# Patient Record
Sex: Male | Born: 1940 | Race: White | Hispanic: No | Marital: Single | Smoking: Former smoker
Health system: Southern US, Community
[De-identification: ages and names within clinical notes are randomized; demographics above are authoritative.]

## PROBLEM LIST (undated history)

## (undated) DIAGNOSIS — R0600 Dyspnea, unspecified: Secondary | ICD-10-CM

## (undated) DIAGNOSIS — G9340 Encephalopathy, unspecified: Secondary | ICD-10-CM

## (undated) HISTORY — DX: Encephalopathy, unspecified: G93.40

## (undated) HISTORY — DX: Dyspnea, unspecified: R06.00

## (undated) HISTORY — PX: BACK SURGERY: SHX140

---

## 2009-05-12 DEATH — deceased

## 2012-06-11 DEATH — deceased

## 2019-12-01 ENCOUNTER — Other Ambulatory Visit: Payer: Self-pay

## 2019-12-01 ENCOUNTER — Emergency Department: Payer: Medicare Other

## 2019-12-01 ENCOUNTER — Inpatient Hospital Stay
Admission: EM | Admit: 2019-12-01 | Discharge: 2019-12-04 | DRG: 178 | Disposition: A | Discharge status: Home Health | Payer: Medicare Other | Attending: Internal Medicine | Admitting: Internal Medicine

## 2019-12-01 DIAGNOSIS — R4182 Altered mental status, unspecified: Secondary | ICD-10-CM

## 2019-12-01 DIAGNOSIS — K5900 Constipation, unspecified: Secondary | ICD-10-CM | POA: Diagnosis present

## 2019-12-01 DIAGNOSIS — E861 Hypovolemia: Secondary | ICD-10-CM | POA: Diagnosis present

## 2019-12-01 DIAGNOSIS — U071 COVID-19: Secondary | ICD-10-CM | POA: Diagnosis present

## 2019-12-01 DIAGNOSIS — R059 Cough, unspecified: Secondary | ICD-10-CM | POA: Diagnosis present

## 2019-12-01 DIAGNOSIS — G9340 Encephalopathy, unspecified: Secondary | ICD-10-CM | POA: Diagnosis not present

## 2019-12-01 DIAGNOSIS — G9349 Other encephalopathy: Secondary | ICD-10-CM | POA: Diagnosis not present

## 2019-12-01 DIAGNOSIS — D509 Iron deficiency anemia, unspecified: Secondary | ICD-10-CM | POA: Diagnosis present

## 2019-12-01 DIAGNOSIS — E871 Hypo-osmolality and hyponatremia: Secondary | ICD-10-CM | POA: Diagnosis present

## 2019-12-01 DIAGNOSIS — E860 Dehydration: Secondary | ICD-10-CM | POA: Diagnosis present

## 2019-12-01 DIAGNOSIS — J9601 Acute respiratory failure with hypoxia: Secondary | ICD-10-CM | POA: Diagnosis not present

## 2019-12-01 DIAGNOSIS — N179 Acute kidney failure, unspecified: Secondary | ICD-10-CM | POA: Diagnosis not present

## 2019-12-01 DIAGNOSIS — A088 Other specified intestinal infections: Secondary | ICD-10-CM | POA: Diagnosis present

## 2019-12-01 DIAGNOSIS — R112 Nausea with vomiting, unspecified: Secondary | ICD-10-CM

## 2019-12-01 DIAGNOSIS — A0839 Other viral enteritis: Secondary | ICD-10-CM

## 2019-12-01 HISTORY — DX: COVID-19: U07.1

## 2019-12-01 LAB — CBC
HCT: 34.1 % — ABNORMAL LOW (ref 39.0–52.0)
Hemoglobin: 10.1 g/dL — ABNORMAL LOW (ref 13.0–17.0)
MCH: 23.2 pg — ABNORMAL LOW (ref 26.0–34.0)
MCHC: 29.6 g/dL — ABNORMAL LOW (ref 30.0–36.0)
MCV: 78.2 fL — ABNORMAL LOW (ref 80.0–100.0)
Platelets: 235 10*3/uL (ref 150–400)
RBC: 4.36 MIL/uL (ref 4.22–5.81)
RDW: 19.9 % — ABNORMAL HIGH (ref 11.5–15.5)
WBC: 6.5 10*3/uL (ref 4.0–10.5)
nRBC: 0 % (ref 0.0–0.2)

## 2019-12-01 LAB — COMPREHENSIVE METABOLIC PANEL
ALT: 16 U/L (ref 0–44)
AST: 32 U/L (ref 15–41)
Albumin: 4.1 g/dL (ref 3.5–5.0)
Alkaline Phosphatase: 47 U/L (ref 38–126)
Anion gap: 13 (ref 5–15)
BUN: 32 mg/dL — ABNORMAL HIGH (ref 8–23)
CO2: 21 mmol/L — ABNORMAL LOW (ref 22–32)
Calcium: 8.2 mg/dL — ABNORMAL LOW (ref 8.9–10.3)
Chloride: 100 mmol/L (ref 98–111)
Creatinine, Ser: 1.59 mg/dL — ABNORMAL HIGH (ref 0.61–1.24)
GFR, Estimated: 40 mL/min — ABNORMAL LOW (ref >60)
Glucose, Bld: 117 mg/dL — ABNORMAL HIGH (ref 70–99)
Potassium: 4.2 mmol/L (ref 3.5–5.1)
Sodium: 134 mmol/L — ABNORMAL LOW (ref 135–145)
Total Bilirubin: 0.9 mg/dL (ref 0.3–1.2)
Total Protein: 7.8 g/dL (ref 6.5–8.1)

## 2019-12-01 LAB — RESPIRATORY PANEL BY RT PCR (FLU A&B, COVID)
Influenza A by PCR: NEGATIVE
Influenza B by PCR: NEGATIVE
SARS Coronavirus 2 by RT PCR: POSITIVE — AB

## 2019-12-01 LAB — TROPONIN I (HIGH SENSITIVITY)
Troponin I (High Sensitivity): 9 ng/L (ref <18)
Troponin I (High Sensitivity): 13 ng/L (ref <18)

## 2019-12-01 LAB — AMMONIA: Ammonia: 13 umol/L (ref 9–35)

## 2019-12-01 LAB — TSH: TSH: 2.176 u[IU]/mL (ref 0.350–4.500)

## 2019-12-01 LAB — BRAIN NATRIURETIC PEPTIDE: B Natriuretic Peptide: 23.7 pg/mL (ref 0.0–100.0)

## 2019-12-01 LAB — MAGNESIUM: Magnesium: 2.6 mg/dL — ABNORMAL HIGH (ref 1.7–2.4)

## 2019-12-01 MED ORDER — SODIUM CHLORIDE 0.9 % IV BOLUS
500.0000 mL | Freq: Once | INTRAVENOUS | Status: AC
  Administered 2019-12-01: 500 mL via INTRAVENOUS

## 2019-12-01 MED ORDER — LABETALOL HCL 5 MG/ML IV SOLN
10.0000 mg | Freq: Once | INTRAVENOUS | Status: DC
  Filled 2019-12-01: qty 4

## 2019-12-01 MED ORDER — ACETAMINOPHEN 650 MG RE SUPP: 650.0000 mg | Freq: Four times a day (QID) | RECTAL | Status: DC | PRN

## 2019-12-01 MED ORDER — ONDANSETRON HCL 4 MG/2ML IJ SOLN: 4.0000 mg | Freq: Four times a day (QID) | INTRAMUSCULAR | Status: DC | PRN

## 2019-12-01 MED ORDER — ACETAMINOPHEN 325 MG PO TABS
650.0000 mg | ORAL_TABLET | Freq: Four times a day (QID) | ORAL | Status: DC | PRN
  Administered 2019-12-04: 650 mg via ORAL
  Filled 2019-12-01: qty 2

## 2019-12-01 MED ORDER — ENOXAPARIN SODIUM 40 MG/0.4ML ~~LOC~~ SOLN
40.0000 mg | SUBCUTANEOUS | Status: DC
  Administered 2019-12-02 – 2019-12-04 (×3): 40 mg via SUBCUTANEOUS
  Filled 2019-12-01 (×3): qty 0.4

## 2019-12-01 MED ORDER — TRAZODONE HCL 50 MG PO TABS: 25.0000 mg | ORAL_TABLET | Freq: Every evening | ORAL | Status: DC | PRN

## 2019-12-01 MED ORDER — SODIUM CHLORIDE 0.9 % IV SOLN: INTRAVENOUS | Status: DC

## 2019-12-01 MED ORDER — ONDANSETRON HCL 4 MG PO TABS: 4.0000 mg | ORAL_TABLET | Freq: Four times a day (QID) | ORAL | Status: DC | PRN

## 2019-12-01 MED ORDER — ASPIRIN EC 81 MG PO TBEC
81.0000 mg | DELAYED_RELEASE_TABLET | Freq: Every day | ORAL | Status: DC
  Administered 2019-12-02 – 2019-12-04 (×3): 81 mg via ORAL
  Filled 2019-12-01 (×3): qty 1

## 2019-12-01 NOTE — ED Provider Notes (Signed)
[facility] Emergency Department Provider Note  ____________________________________________   First MD Initiated Contact with Patient 12/01/19 2059     (approximate)  I have reviewed the triage vital signs and the nursing notes.   HISTORY  Chief Complaint Cough, Fever, and Altered Mental Status   HPI Dennis Wilkins is a 79 y.o. male without significant past medical history who presents for assessment of 1 to 2 weeks of cough associate with subjective fevers, chills, severe fatigue, and increasing forgetfulness.  No prior similar episodes.  No clear alleviating aggravating factors.  Patient denies any headache, vision changes, vertigo, chest pain, shortness of breath, vomiting, diarrhea, dysuria, abdominal pain, rash, urinary symptoms, focal extremity pain, recent falls or injuries, EtOH abuse, illicit drug use, tobacco abuse, or other acute sick symptoms.  Denies taking any daily medications or supplements.     History reviewed. No pertinent past medical history.  There are no problems to display for this patient.    Prior to Admission medications   Not on File    Allergies Patient has no known allergies.  History reviewed. No pertinent family history.  Social History Social History   Tobacco Use  . Smoking status: Not on file  Substance Use Topics  . Alcohol use: Not on file  . Drug use: Not on file    Review of Systems  Review of Systems  Constitutional: Positive for chills, fever and malaise/fatigue.  HENT: Negative for sore throat.   Eyes: Negative for pain.  Respiratory: Positive for cough. Negative for stridor.   Cardiovascular: Negative for chest pain.  Gastrointestinal: Positive for diarrhea ( per son), nausea ( per son) and vomiting ( per son).  Genitourinary: Negative for dysuria.  Musculoskeletal: Positive for myalgias.  Skin: Negative for rash.  Neurological: Negative for seizures, loss of consciousness and headaches.    Psychiatric/Behavioral: Positive for memory loss. Negative for suicidal ideas.  All other systems reviewed and are negative.     ____________________________________________   PHYSICAL EXAM:  VITAL SIGNS: ED Triage Vitals  Enc Vitals Group     BP 12/01/19 1450 (!) 180/84     Pulse Rate 12/01/19 1450 100     Resp 12/01/19 1450 18     Temp 12/01/19 1450 98.8 F (37.1 C)     Temp Source 12/01/19 1450 Oral     SpO2 12/01/19 1450 100 %     Weight 12/01/19 1452 180 lb (81.6 kg)     Height 12/01/19 1452 5\' 9"  (1.753 m)     Head Circumference --      Peak Flow --      Pain Score 12/01/19 1452 5     Pain Loc --      Pain Edu? --      Excl. in GC? --    Vitals:   12/01/19 1450 12/01/19 2220  BP: (!) 180/84 (!) 157/69  Pulse: 100 98  Resp: 18 16  Temp: 98.8 F (37.1 C)   SpO2: 100% 99%   Physical Exam Vitals and nursing note reviewed.  Constitutional:      Appearance: He is well-developed.  HENT:     Head: Normocephalic and atraumatic.     Right Ear: External ear normal.     Left Ear: External ear normal.     Nose: Nose normal.     Mouth/Throat:     Mouth: Mucous membranes are dry.  Eyes:     Conjunctiva/sclera: Conjunctivae normal.  Cardiovascular:  Rate and Rhythm: Normal rate and regular rhythm.     Heart sounds: No murmur heard.   Pulmonary:     Effort: Pulmonary effort is normal. No respiratory distress.     Breath sounds: Normal breath sounds.  Abdominal:     Palpations: Abdomen is soft.     Tenderness: There is no abdominal tenderness.  Musculoskeletal:     Cervical back: Neck supple.  Skin:    General: Skin is warm and dry.     Capillary Refill: Capillary refill takes 2 to 3 seconds.  Neurological:     General: No focal deficit present.     Mental Status: He is alert.     No pronator drift.  No finger dysmetria.  Cranial nerves II through XII grossly intact.  Patient has full and symmetric strength of all extremities.  Sensation is intact to  light touch throughout all extremities.  Patient is oriented to day and year but not month.  Patient states it is January.  ____________________________________________   LABS (all labs ordered are listed, but only abnormal results are displayed)  Labs Reviewed  COMPREHENSIVE METABOLIC PANEL - Abnormal; Notable for the following components:      Result Value   Sodium 134 (*)    CO2 21 (*)    Glucose, Bld 117 (*)    BUN 32 (*)    Creatinine, Ser 1.59 (*)    Calcium 8.2 (*)    GFR, Estimated 40 (*)    All other components within normal limits  CBC - Abnormal; Notable for the following components:   Hemoglobin 10.1 (*)    HCT 34.1 (*)    MCV 78.2 (*)    MCH 23.2 (*)    MCHC 29.6 (*)    RDW 19.9 (*)    All other components within normal limits  MAGNESIUM - Abnormal; Notable for the following components:   Magnesium 2.6 (*)    All other components within normal limits  RESPIRATORY PANEL BY RT PCR (FLU A&B, COVID)  BRAIN NATRIURETIC PEPTIDE  AMMONIA  PROCALCITONIN  TSH  URINALYSIS, COMPLETE (UACMP) WITH MICROSCOPIC  CBG MONITORING, ED  TROPONIN I (HIGH SENSITIVITY)  TROPONIN I (HIGH SENSITIVITY)   ____________________________________________  EKG  Sinus rhythm with a ventricular rate of 97, normal axis, unremarkable intervals, nonspecific ST changes in the inferior and lateral leads with some artifact in aVL and aVF without other clear evidence of acute ischemia. ____________________________________________  RADIOLOGY  ED MD interpretation: No significant focal consolidation, effusion, pneumothorax, edema, or other clear acute intrathoracic abnormality.  Official radiology report(s): DG Chest 2 View  Result Date: 12/01/2019 CLINICAL DATA:  Shortness of breath. Additional history provided: Cough, fever, forgetfulness, symptoms for 1 week. EXAM: CHEST - 2 VIEW COMPARISON:  No pertinent prior exams are available for comparison. FINDINGS: Heart size within normal limits.  Aortic atherosclerosis. Minimal atelectasis at the left lung base. No appreciable airspace consolidation or pulmonary edema. No evidence of pleural effusion or pneumothorax. No acute bony abnormality identified. IMPRESSION: Minimal atelectasis at the left lung base. Otherwise, there is no evidence of acute cardiopulmonary abnormality. Aortic Atherosclerosis (ICD10-I70.0). Electronically Signed   By: Jackey Loge DO   On: 12/01/2019 15:20   CT Head Wo Contrast  Result Date: 12/01/2019 CLINICAL DATA:  Cough and fever with mental status changes EXAM: CT HEAD WITHOUT CONTRAST TECHNIQUE: Contiguous axial images were obtained from the base of the skull through the vertex without intravenous contrast. COMPARISON:  None. FINDINGS: Brain: No evidence  of acute infarction, hemorrhage, hydrocephalus, extra-axial collection or mass lesion/mass effect. Chronic atrophic and ischemic changes are noted. Vascular: No hyperdense vessel or unexpected calcification. Skull: Normal. Negative for fracture or focal lesion. Sinuses/Orbits: Orbits and their contents are within normal limits. Air-fluid levels are noted within the sphenoid sinus with mucosal changes in the ethmoid sinuses consistent with sinusitis. Other: None IMPRESSION: Chronic atrophic and ischemic changes. No acute intracranial abnormality noted. Changes consistent with acute sinusitis as described. Electronically Signed   By: Alcide Clever M.D.   On: 12/01/2019 21:24    ____________________________________________   PROCEDURES  Procedure(s) performed (including Critical Care):  .1-3 Lead EKG Interpretation Performed by: Gilles Chiquito, MD Authorized by: Gilles Chiquito, MD     Interpretation: normal     ECG rate assessment: normal     Rhythm: sinus rhythm     Ectopy: none       ____________________________________________   INITIAL IMPRESSION / ASSESSMENT AND PLAN / ED COURSE        Patient presents with above to history exam for  assessment of cough, fatigue, subjective fevers and chills, and forgetfulness over the past several weeks.  Patient is hypertensive with a BP of 180/84 with otherwise stable vital signs on room air.  Differential includes but is not limited to pneumonia, Covid, ACS, hypertensive encephalopathy, metabolic derangements, uremic encephalopathy, urinary tract infection, CVA, elevated ammonia, thyroid derangements, and other infectious etiologies.  No focal deficits on exam or CT head findings or other historical findings to suggest CVA.  No historical or exam factors to suggest an acute traumatic injury.  Chest x-ray shows no evidence of pneumonia.  CBC does show evidence of anemia with a hemoglobin of 10.1 with no other significant derangements.  There are no prior CBCs to compare this to.  CMP shows reduced kidney function with a creatinine of 1.59 and a GFR of 40 without any other significant electrolyte or metabolic derangements.  LFTs are unremarkable.  There is no prior kidney function to compare to in the medical record.  Magnesium is 2.6.  Low suspicion for ACS given patient denies any chest pain and has a relatively reassuring EKG and nonelevated troponin.  BNP is 23.7 and patient has no findings on chest x-ray exam suggest significant volume overload.  Ammonia is not elevated.  I was able to reach patient's son stated that patient also had been complaining of some vomiting and diarrhea.  He also stated his father is typically alert and oriented.  Vital sign possible patient is possible patient is encephalopathic from dehydration elevated BUN with viral infection including COVID-19 high within differential also plan to assess for urinary tract infection with UA.  Given patient is encephalopathic and not oriented I do believe she warrants admission for observation and further work-up.  ____________________________________________   FINAL CLINICAL IMPRESSION(S) / ED DIAGNOSES  Final diagnoses:    Acute encephalopathy  Altered mental status, unspecified altered mental status type  Acute kidney injury (HCC)    Medications  labetalol (NORMODYNE) injection 10 mg (10 mg Intravenous Not Given 12/01/19 2221)  sodium chloride 0.9 % bolus 500 mL (has no administration in time range)     ED Discharge Orders    None       Note:  This document was prepared using Dragon voice recognition software and may include unintentional dictation errors.   Gilles Chiquito, MD 12/01/19 [phone]

## 2019-12-01 NOTE — ED Triage Notes (Signed)
Pt comes POV with cough, fever, and "forgetting stuff". Symptoms started about a week. Pt ambulatory to triage.

## 2019-12-02 ENCOUNTER — Encounter: Payer: Self-pay | Admitting: Family Medicine

## 2019-12-02 ENCOUNTER — Other Ambulatory Visit: Payer: Self-pay

## 2019-12-02 DIAGNOSIS — G9340 Encephalopathy, unspecified: Secondary | ICD-10-CM

## 2019-12-02 LAB — FERRITIN
Ferritin: 27 ng/mL (ref 24–336)
Ferritin: 30 ng/mL (ref 24–336)

## 2019-12-02 LAB — TROPONIN I (HIGH SENSITIVITY)
Troponin I (High Sensitivity): 13 ng/L (ref <18)
Troponin I (High Sensitivity): 15 ng/L (ref <18)

## 2019-12-02 LAB — CBC WITH DIFFERENTIAL/PLATELET
Abs Immature Granulocytes: 0.02 10*3/uL (ref 0.00–0.07)
Basophils Absolute: 0 10*3/uL (ref 0.0–0.1)
Basophils Relative: 0 %
Eosinophils Absolute: 0 10*3/uL (ref 0.0–0.5)
Eosinophils Relative: 0 %
HCT: 31.1 % — ABNORMAL LOW (ref 39.0–52.0)
Hemoglobin: 9.3 g/dL — ABNORMAL LOW (ref 13.0–17.0)
Immature Granulocytes: 0 %
Lymphocytes Relative: 15 %
Lymphs Abs: 0.8 10*3/uL (ref 0.7–4.0)
MCH: 23.5 pg — ABNORMAL LOW (ref 26.0–34.0)
MCHC: 29.9 g/dL — ABNORMAL LOW (ref 30.0–36.0)
MCV: 78.5 fL — ABNORMAL LOW (ref 80.0–100.0)
Monocytes Absolute: 0.3 10*3/uL (ref 0.1–1.0)
Monocytes Relative: 5 %
Neutro Abs: 4.1 10*3/uL (ref 1.7–7.7)
Neutrophils Relative %: 80 %
Platelets: 190 10*3/uL (ref 150–400)
RBC: 3.96 MIL/uL — ABNORMAL LOW (ref 4.22–5.81)
RDW: 20.4 % — ABNORMAL HIGH (ref 11.5–15.5)
WBC: 5.2 10*3/uL (ref 4.0–10.5)
nRBC: 0 % (ref 0.0–0.2)

## 2019-12-02 LAB — COMPREHENSIVE METABOLIC PANEL
ALT: 16 U/L (ref 0–44)
AST: 29 U/L (ref 15–41)
Albumin: 3.5 g/dL (ref 3.5–5.0)
Alkaline Phosphatase: 46 U/L (ref 38–126)
Anion gap: 11 (ref 5–15)
BUN: 29 mg/dL — ABNORMAL HIGH (ref 8–23)
CO2: 19 mmol/L — ABNORMAL LOW (ref 22–32)
Calcium: 7.4 mg/dL — ABNORMAL LOW (ref 8.9–10.3)
Chloride: 105 mmol/L (ref 98–111)
Creatinine, Ser: 1.21 mg/dL (ref 0.61–1.24)
GFR, Estimated: 56 mL/min — ABNORMAL LOW (ref >60)
Glucose, Bld: 119 mg/dL — ABNORMAL HIGH (ref 70–99)
Potassium: 4.2 mmol/L (ref 3.5–5.1)
Sodium: 135 mmol/L (ref 135–145)
Total Bilirubin: 0.8 mg/dL (ref 0.3–1.2)
Total Protein: 6.7 g/dL (ref 6.5–8.1)

## 2019-12-02 LAB — C-REACTIVE PROTEIN: CRP: 4 mg/dL — ABNORMAL HIGH (ref <1.0)

## 2019-12-02 LAB — URINALYSIS, COMPLETE (UACMP) WITH MICROSCOPIC
Bacteria, UA: NONE SEEN
Bilirubin Urine: NEGATIVE
Glucose, UA: NEGATIVE mg/dL
Hgb urine dipstick: NEGATIVE
Ketones, ur: 20 mg/dL — AB
Leukocytes,Ua: NEGATIVE
Nitrite: NEGATIVE
Protein, ur: 30 mg/dL — AB
Specific Gravity, Urine: 1.023 (ref 1.005–1.030)
Squamous Epithelial / HPF: NONE SEEN (ref 0–5)
pH: 5 (ref 5.0–8.0)

## 2019-12-02 LAB — FIBRIN DERIVATIVES D-DIMER (ARMC ONLY)
Fibrin derivatives D-dimer (ARMC): 786.57 ng/mL (FEU) — ABNORMAL HIGH (ref 0.00–499.00)
Fibrin derivatives D-dimer (ARMC): 810.09 ng/mL (FEU) — ABNORMAL HIGH (ref 0.00–499.00)

## 2019-12-02 LAB — LACTATE DEHYDROGENASE: LDH: 183 U/L (ref 98–192)

## 2019-12-02 LAB — PROCALCITONIN
Procalcitonin: <0.1 ng/mL
Procalcitonin: <0.1 ng/mL

## 2019-12-02 MED ORDER — METHYLPREDNISOLONE SODIUM SUCC 125 MG IJ SOLR
1.0000 mg/kg | Freq: Two times a day (BID) | INTRAMUSCULAR | Status: DC
  Administered 2019-12-02 (×2): 81.875 mg via INTRAVENOUS
  Filled 2019-12-02 (×2): qty 2

## 2019-12-02 MED ORDER — GUAIFENESIN ER 600 MG PO TB12
600.0000 mg | ORAL_TABLET | Freq: Two times a day (BID) | ORAL | Status: DC
  Administered 2019-12-02 – 2019-12-04 (×6): 600 mg via ORAL
  Filled 2019-12-02 (×6): qty 1

## 2019-12-02 MED ORDER — HYDROCOD POLST-CPM POLST ER 10-8 MG/5ML PO SUER: 5.0000 mL | Freq: Two times a day (BID) | ORAL | Status: DC | PRN

## 2019-12-02 MED ORDER — VITAMIN D 25 MCG (1000 UNIT) PO TABS
1000.0000 [IU] | ORAL_TABLET | Freq: Every day | ORAL | Status: DC
  Administered 2019-12-02 – 2019-12-04 (×3): 1000 [IU] via ORAL
  Filled 2019-12-02 (×3): qty 1

## 2019-12-02 MED ORDER — SODIUM CHLORIDE 0.9 % IV SOLN
200.0000 mg | Freq: Once | INTRAVENOUS | Status: AC
  Administered 2019-12-02: 200 mg via INTRAVENOUS
  Filled 2019-12-02: qty 200

## 2019-12-02 MED ORDER — ASCORBIC ACID 500 MG PO TABS
500.0000 mg | ORAL_TABLET | Freq: Every day | ORAL | Status: DC
  Administered 2019-12-02 – 2019-12-04 (×3): 500 mg via ORAL
  Filled 2019-12-02 (×3): qty 1

## 2019-12-02 MED ORDER — MELATONIN 5 MG PO TABS
5.0000 mg | ORAL_TABLET | Freq: Every day | ORAL | Status: DC
  Filled 2019-12-02: qty 1

## 2019-12-02 MED ORDER — DIPHENHYDRAMINE HCL 50 MG/ML IJ SOLN
25.0000 mg | Freq: Once | INTRAMUSCULAR | Status: AC
  Administered 2019-12-02: 25 mg via INTRAVENOUS
  Filled 2019-12-02: qty 1

## 2019-12-02 MED ORDER — ZINC SULFATE 220 (50 ZN) MG PO CAPS
220.0000 mg | ORAL_CAPSULE | Freq: Every day | ORAL | Status: DC
  Administered 2019-12-02 – 2019-12-04 (×3): 220 mg via ORAL
  Filled 2019-12-02 (×3): qty 1

## 2019-12-02 MED ORDER — GUAIFENESIN-DM 100-10 MG/5ML PO SYRP
10.0000 mL | ORAL_SOLUTION | ORAL | Status: DC | PRN
  Filled 2019-12-02: qty 10

## 2019-12-02 MED ORDER — PREDNISONE 50 MG PO TABS: 50.0000 mg | ORAL_TABLET | Freq: Every day | ORAL | Status: DC

## 2019-12-02 MED ORDER — SODIUM CHLORIDE 0.9 % IV SOLN
100.0000 mg | Freq: Every day | INTRAVENOUS | Status: DC
  Administered 2019-12-02 – 2019-12-04 (×3): 100 mg via INTRAVENOUS
  Filled 2019-12-02 (×4): qty 20

## 2019-12-02 MED ORDER — RAMELTEON 8 MG PO TABS
8.0000 mg | ORAL_TABLET | Freq: Every day | ORAL | Status: DC
  Administered 2019-12-02 – 2019-12-03 (×2): 8 mg via ORAL
  Filled 2019-12-02 (×4): qty 1

## 2019-12-02 NOTE — Progress Notes (Signed)
PROGRESS NOTE    Dennis Wilkins   RSW:[id number]  DOB: [birth year]  PCP: Patient, No Pcp Per    DOA: 12/01/2019 LOS: 1   Brief Narrative   Dennis Wilkins  is a 79 y.o. Caucasian male with no known chronic medical problems, who presented to the ED on 12/01/19 with altered mental status with declining memory over the last couple of weeks with subjective fever and chills.  Son also reported nausea/vomiting and diarrhea, dry cough without shortness of breath.  Evaluation in the ED revealed Covid-19 infection, uncontrolled BP, oxygen sat with ambulation on room air was in mid-80's.  Patient treated in the ED with labetalol, IV fluids, IV steroid, and started on remdesivir.     Assessment & Plan   Active Problems:   Encephalopathy   COVID-19   Covid-19 Infection with gastroenteritis - presented with GI symptoms and confusion, dry cough Acute encephalpathy due to Covid-19 - presented with confusion Acute respiratory failure with hypoxia - initially required supplemental oxygen briefly, since weaned off.   --Continue remdesevir (10/21 >> ) --Stop steroids since not hypoxic and minimal respiratory symptoms, only mild dry cough, concern steroids will worsen his confusion --O2 if needed to keep spO2>90% --Follow inflammatory markers, CMP, CBC --Antibiotics deferred given negative procal on admission --Lovenox for VTE prophylaxis --Antitussives PRN --Vitamin C, zinc, vitamin D, ASA --IV fluids  Acute kidney injury - present on admission likely due to hypovolemia in setting of N/V/D, poor PO intake. Cr trend: 1.59 >> 1.12.  Resolved as of 10/21. --Stop IV fluids --BMP to monitor --Avoid nephrotoxins and hypotension   DVT prophylaxis: enoxaparin (LOVENOX) injection 40 mg Start: 12/01/19 1000   Diet:  Diet Orders (From admission, onward)    Start     Ordered   12/02/19 0000  Diet Heart Room service appropriate? Yes; Fluid consistency: Thin  Diet effective now       Question Answer  Comment  Room service appropriate? Yes   Fluid consistency: Thin      12/02/19 0002            Code Status: Full Code    Subjective 12/02/19    Pt seen this AM in ED holding for a bed.  He reports still being confused, but little better today.  Denies fever/chills or shortness of breath.   Disposition Plan & Communication   Status is: Inpatient  Remains inpatient appropriate because:IV treatments appropriate due to intensity of illness or inability to take PO   Dispo: The patient is from: Home              Anticipated d/c is to: Home              Anticipated d/c date is: 2-3 days              Patient currently is not medically stable to d/c.    Family Communication: spoke with daughter who called patient's cell phone during encounter.  Called son and updated him by phone this afternoon.  Pt lives alone in a condo.  Independent with ambulation.   Consults, Procedures, Significant Events   Consultants:   none  Procedures:   none  Antimicrobials:  Anti-infectives (From admission, onward)   Start     Dose/Rate Route Frequency Ordered Stop   12/02/19 1000  remdesivir 100 mg in sodium chloride 0.9 % 100 mL IVPB       "Followed by" Linked Group Details   100 mg 200 mL/hr over 30  Minutes Intravenous Daily 12/02/19 0002 12/06/19 0959   12/02/19 0015  remdesivir 200 mg in sodium chloride 0.9% 250 mL IVPB       "Followed by" Linked Group Details   200 mg 580 mL/hr over 30 Minutes Intravenous Once 12/02/19 0002 12/02/19 0249         Objective   Vitals:   12/02/19 0950 12/02/19 1100 12/02/19 1300 12/02/19 1502  BP: (!) 166/81 (!) 111/93 (!) 149/83 (!) 174/77  Pulse: 80 98 76 79  Resp: 20 15 19 16   Temp: (!) 97.4 F (36.3 C)   97.9 F (36.6 C)  TempSrc: Oral   Oral  SpO2: 97% 98% 96% 98%  Weight:      Height:       No intake or output data in the 24 hours ending 12/02/19 1559 Filed Weights   12/01/19 1452  Weight: 81.6 kg    Physical Exam:  General  exam: awake, alert, no acute distress, mildly confused Respiratory system: CTAB, no wheezes, rales or rhonchi, normal respiratory effort. Cardiovascular system: normal S1/S2, RRR, no JVD, murmurs, rubs, gallops, no pedal edema.   Gastrointestinal system: soft, NT, ND, +bowel sounds. Central nervous system: A&O x3 (not oriented to situation). no gross focal neurologic deficits, normal speech Extremities: moves all, no edema, normal tone Psychiatry: normal mood, congruent affect, abnormal judgement and insight normal  Labs   Data Reviewed: I have personally reviewed following labs and imaging studies  CBC: Recent Labs  Lab 12/01/19 1504 12/02/19 0515  WBC 6.5 5.2  NEUTROABS  --  4.1  HGB 10.1* 9.3*  HCT 34.1* 31.1*  MCV 78.2* 78.5*  PLT 235 190   Basic Metabolic Panel: Recent Labs  Lab 12/01/19 1504 12/01/19 2034 12/02/19 0515  NA 134*  --  135  K 4.2  --  4.2  CL 100  --  105  CO2 21*  --  19*  GLUCOSE 117*  --  119*  BUN 32*  --  29*  CREATININE 1.59*  --  1.21  CALCIUM 8.2*  --  7.4*  MG  --  2.6*  --    GFR: Estimated Creatinine Clearance: 47.9 mL/min (by C-G formula based on SCr of 1.21 mg/dL). Liver Function Tests: Recent Labs  Lab 12/01/19 1504 12/02/19 0515  AST 32 29  ALT 16 16  ALKPHOS 47 46  BILITOT 0.9 0.8  PROT 7.8 6.7  ALBUMIN 4.1 3.5   No results for input(s): LIPASE, AMYLASE in the last 168 hours. Recent Labs  Lab 12/01/19 2220  AMMONIA 13   Coagulation Profile: No results for input(s): INR, PROTIME in the last 168 hours. Cardiac Enzymes: No results for input(s): CKTOTAL, CKMB, CKMBINDEX, TROPONINI in the last 168 hours. BNP (last 3 results) No results for input(s): PROBNP in the last 8760 hours. HbA1C: No results for input(s): HGBA1C in the last 72 hours. CBG: No results for input(s): GLUCAP in the last 168 hours. Lipid Profile: No results for input(s): CHOL, HDL, LDLCALC, TRIG, CHOLHDL, LDLDIRECT in the last 72 hours. Thyroid  Function Tests: Recent Labs    12/01/19 2220  TSH 2.176   Anemia Panel: Recent Labs    12/02/19 0104 12/02/19 0515  FERRITIN 27 30   Sepsis Labs: Recent Labs  Lab 12/01/19 2034 12/02/19 0104  PROCALCITON <0.10 <0.10    Recent Results (from the past 240 hour(s))  Respiratory Panel by RT PCR (Flu A&B, Covid) - Nasopharyngeal Swab     Status: Abnormal  Collection Time: 12/01/19 10:20 PM   Specimen: Nasopharyngeal Swab  Result Value Ref Range Status   SARS Coronavirus 2 by RT PCR POSITIVE (A) NEGATIVE Final    Comment: RESULT CALLED TO, READ BACK BY AND VERIFIED WITH: NOAH Hussain Maimone RN 2340 0/20/21 HNM (NOTE) SARS-CoV-2 target nucleic acids are DETECTED.  SARS-CoV-2 RNA is generally detectable in upper respiratory specimens  during the acute phase of infection. Positive results are indicative of the presence of the identified virus, but do not rule out bacterial infection or co-infection with other pathogens not detected by the test. Clinical correlation with patient history and other diagnostic information is necessary to determine patient infection status. The expected result is Negative.  Fact Sheet for Patients:  https://www.moore.com/  Fact Sheet for Healthcare Providers: https://www.young.biz/  This test is not yet approved or cleared by the [place] FDA and  has been authorized for detection and/or diagnosis of SARS-CoV-2 by FDA under an Emergency Use Authorization (EUA).  This EUA will remain in effect (meaning this test can be u sed) for the duration of  the COVID-19 declaration under Section 564(b)(1) of the Act, 21 U.S.C. section 360bbb-3(b)(1), unless the authorization is terminated or revoked sooner.      Influenza A by PCR NEGATIVE NEGATIVE Final   Influenza B by PCR NEGATIVE NEGATIVE Final    Comment: (NOTE) The Xpert Xpress SARS-CoV-2/FLU/RSV assay is intended as an aid in  the diagnosis of influenza  from Nasopharyngeal swab specimens and  should not be used as a sole basis for treatment. Nasal washings and  aspirates are unacceptable for Xpert Xpress SARS-CoV-2/FLU/RSV  testing.  Fact Sheet for Patients: https://www.moore.com/  Fact Sheet for Healthcare Providers: https://www.young.biz/  This test is not yet approved or cleared by the [place] FDA and  has been authorized for detection and/or diagnosis of SARS-CoV-2 by  FDA under an Emergency Use Authorization (EUA). This EUA will remain  in effect (meaning this test can be used) for the duration of the  Covid-19 declaration under Section 564(b)(1) of the Act, 21  U.S.C. section 360bbb-3(b)(1), unless the authorization is  terminated or revoked. Performed at [facility], [address] [place], [place] [address]       Imaging Studies   DG Chest 2 View  Result Date: 12/01/2019 CLINICAL DATA:  Shortness of breath. Additional history provided: Cough, fever, forgetfulness, symptoms for 1 week. EXAM: CHEST - 2 VIEW COMPARISON:  No pertinent prior exams are available for comparison. FINDINGS: Heart size within normal limits. Aortic atherosclerosis. Minimal atelectasis at the left lung base. No appreciable airspace consolidation or pulmonary edema. No evidence of pleural effusion or pneumothorax. No acute bony abnormality identified. IMPRESSION: Minimal atelectasis at the left lung base. Otherwise, there is no evidence of acute cardiopulmonary abnormality. Aortic Atherosclerosis (ICD10-I70.0). Electronically Signed   By: Jackey Loge DO   On: 12/01/2019 15:20   CT Head Wo Contrast  Result Date: 12/01/2019 CLINICAL DATA:  Cough and fever with mental status changes EXAM: CT HEAD WITHOUT CONTRAST TECHNIQUE: Contiguous axial images were obtained from the base of the skull through the vertex without intravenous contrast. COMPARISON:  None. FINDINGS: Brain: No evidence of acute  infarction, hemorrhage, hydrocephalus, extra-axial collection or mass lesion/mass effect. Chronic atrophic and ischemic changes are noted. Vascular: No hyperdense vessel or unexpected calcification. Skull: Normal. Negative for fracture or focal lesion. Sinuses/Orbits: Orbits and their contents are within normal limits. Air-fluid levels are noted within the sphenoid sinus with mucosal changes in  the ethmoid sinuses consistent with sinusitis. Other: None IMPRESSION: Chronic atrophic and ischemic changes. No acute intracranial abnormality noted. Changes consistent with acute sinusitis as described. Electronically Signed   By: Alcide CleverMark  Lukens M.D.   On: 12/01/2019 21:24     Medications   Scheduled Meds: . vitamin C  500 mg Oral Daily  . aspirin EC  81 mg Oral Daily  . cholecalciferol  1,000 Units Oral Daily  . enoxaparin (LOVENOX) injection  40 mg Subcutaneous Q24H  . guaiFENesin  600 mg Oral BID  . labetalol  10 mg Intravenous Once  . methylPREDNISolone (SOLU-MEDROL) injection  1 mg/kg Intravenous Q12H   Followed by  . [START ON 12/05/2019] predniSONE  50 mg Oral Daily  . ramelteon  8 mg Oral QHS  . zinc sulfate  220 mg Oral Daily   Continuous Infusions: . sodium chloride 100 mL/hr at 12/02/19 0249  . remdesivir 100 mg in NS 100 mL Stopped (12/02/19 1023)       LOS: 1 day    Time spent: 30 minutes    Pennie BanterKelly A Len Azeez, DO [facility]  12/02/2019, 3:59 PM    If 7PM-7AM, please contact night-coverage. How to contact the [facility] Attending or Consulting provider 7A - 7P or covering provider during after hours 7P -7A, for this patient?    1. Check the care team in [facility] and look for a) attending/consulting [facility] provider listed and b) the [facility] team listed 2. Log into [email or web address] and use [place]'s universal password to access. If you do not have the password, please contact the hospital operator. 3. Locate the [facility] provider you are looking for under [facility] and page to a  number that you can be directly reached. 4. If you still have difficulty reaching the provider, please page the [facility] (Director on Call) for the Hospitalists listed on amion for assistance.

## 2019-12-02 NOTE — ED Notes (Signed)
Pt found in his room wandering with his IV pulled out.  Pt confused on where he is and why he is at the hospital.  Pt able to be calmed down and placed back in his bed at this time.  RN called Pt's son to update him on plan of care and to bring comfort to the patient.

## 2019-12-02 NOTE — ED Notes (Signed)
Pt given breakfast tray and was sat up in the bed in order to eat.  Pt tolerating well.

## 2019-12-02 NOTE — ED Notes (Signed)
Nurse Zella Ball informed of assigned bed

## 2019-12-02 NOTE — Progress Notes (Signed)
Remdesivir - Pharmacy Brief Note   O:  ALT: 16 CXR: no evidence of PNA  SpO2: 98 % on RA    A/P:  Remdesivir 200 mg IVPB once followed by 100 mg IVPB daily x 4 days.   Bluford Sedler D 12/02/2019 1:21 AM

## 2019-12-02 NOTE — H&P (Addendum)
[place]   PATIENT NAME: Dennis Wilkins    MR#:  [id number]  DATE OF BIRTH:  [birth year]  DATE OF ADMISSION:  12/01/2019  PRIMARY CARE PHYSICIAN: Patient, No Pcp Per   REQUESTING/REFERRING PHYSICIAN: Antoine Primas, MD  CHIEF COMPLAINT:   Chief Complaint  Patient presents with  . Cough  . Fever  . Altered Mental Status    HISTORY OF PRESENT ILLNESS:  Dennis Wilkins  is a 79 y.o. Caucasian male with no known chronic medical problems, who presented to the emergency room with acute onset of altered mental status with declining memory over the last couple of weeks with subjective fever and chills.  His son who gives part of the history as well as intermittent nausea and vomiting with diarrhea with loose and watery bowel movements otherwise normal vital signs no bright red bleeding per rectum or melena.  He admitted to having dry cough without wheezing or dyspnea.  No chest pain or palpitations.  No dysuria, oliguria or hematuria, urgency or frequency or flank pain.  Upon presentation to the emergency room, blood pressure was 180/84 with otherwise normal vital signs however heart rate came up from 100-152 and later 140.  His pulse oximetry later on was 98% on 2 L of O2 by nasal cannula and respiratory rate 21.  Upon ambulation the patient's pulse ox and she dropped to mid 80s.  Labs revealed mild hyponatremia and a BN of 32 with a creatinine 1.59 with a magnesium of 2.6 and ammonia level of 13.  High-sensitivity troponin I was 9 and later 13 and BNP 23.7.  CBC showed anemia with hemoglobin of 10.1 hematocrit 34.1.  TSH was 2.176.  Influenza antigens came back negative.  COVID-19 PCR came back positive.  Two-view chest x-ray showed minimal atelectasis in the left lung base with no acute cardiopulmonary disease.  It showed aortic atherosclerosis. EKG showed sinus rhythm with rate of 97 with PACs.  The patient was given 500 ml IV Normal saline bolus and 10 mg of IV labetalol.  He will be started  on IV remdesivir and Solu-Medrol.  He will be admitted to a medically monitored bed for further evaluation and management. PAST MEDICAL HISTORY:   Past Medical History:  Diagnosis Date  . COVID-19 12/01/2019   diagnosed at [facility] on 12/01/2019    PAST SURGICAL HISTORY:  He denies any previous surgeries.  SOCIAL HISTORY:   Social History   Tobacco Use  . Smoking status: Not on file  Substance Use Topics  . Alcohol use: Not on file    FAMILY HISTORY:  History reviewed. No pertinent family history.  He does not recall any familial diseases  DRUG ALLERGIES:  No Known Allergies  REVIEW OF SYSTEMS:   ROS As per history of present illness. All pertinent systems were reviewed above. Constitutional, HEENT, cardiovascular, respiratory, GI, GU, musculoskeletal, neuro, psychiatric, endocrine, integumentary and hematologic systems were reviewed and are otherwise negative/unremarkable except for positive findings mentioned above in the HPI.   MEDICATIONS AT HOME:   Prior to Admission medications   Not on File      VITAL SIGNS:  Blood pressure (!) 157/69, pulse (!) 152, temperature 98.8 F (37.1 C), temperature source Oral, resp. rate 16, height 5\' 9"  (1.753 m), weight 81.6 kg, SpO2 98 %.  PHYSICAL EXAMINATION:  Physical Exam  GENERAL:  79 y.o.-year-old Caucasian male patient lying in the bed with no acute distress.  EYES: Pupils equal, round, reactive to light and  accommodation. No scleral icterus. Extraocular muscles intact.  HEENT: Head atraumatic, normocephalic. Oropharynx and nasopharynx clear.  NECK:  Supple, no jugular venous distention. No thyroid enlargement, no tenderness.  LUNGS: Normal breath sounds bilaterally, no wheezing, rales,rhonchi or crepitation. No use of accessory muscles of respiration.  CARDIOVASCULAR: Regular rate and rhythm, S1, S2 normal. No murmurs, rubs, or gallops.  ABDOMEN: Soft, nondistended, nontender. Bowel sounds present. No organomegaly or  mass.  EXTREMITIES: No pedal edema, cyanosis, or clubbing.  NEUROLOGIC: Cranial nerves II through XII are intact. Muscle strength 5/5 in all extremities. Sensation intact. Gait not checked.  PSYCHIATRIC: The patient is alert and oriented x 3.  Normal affect and good eye contact. SKIN: No obvious rash, lesion, or ulcer.   LABORATORY PANEL:   CBC Recent Labs  Lab 12/01/19 1504  WBC 6.5  HGB 10.1*  HCT 34.1*  PLT 235   ------------------------------------------------------------------------------------------------------------------  Chemistries  Recent Labs  Lab 12/01/19 1504 12/01/19 2034  NA 134*  --   K 4.2  --   CL 100  --   CO2 21*  --   GLUCOSE 117*  --   BUN 32*  --   CREATININE 1.59*  --   CALCIUM 8.2*  --   MG  --  2.6*  AST 32  --   ALT 16  --   ALKPHOS 47  --   BILITOT 0.9  --    ------------------------------------------------------------------------------------------------------------------  Cardiac Enzymes No results for input(s): TROPONINI in the last 168 hours. ------------------------------------------------------------------------------------------------------------------  RADIOLOGY:  DG Chest 2 View  Result Date: 12/01/2019 CLINICAL DATA:  Shortness of breath. Additional history provided: Cough, fever, forgetfulness, symptoms for 1 week. EXAM: CHEST - 2 VIEW COMPARISON:  No pertinent prior exams are available for comparison. FINDINGS: Heart size within normal limits. Aortic atherosclerosis. Minimal atelectasis at the left lung base. No appreciable airspace consolidation or pulmonary edema. No evidence of pleural effusion or pneumothorax. No acute bony abnormality identified. IMPRESSION: Minimal atelectasis at the left lung base. Otherwise, there is no evidence of acute cardiopulmonary abnormality. Aortic Atherosclerosis (ICD10-I70.0). Electronically Signed   By: Jackey Loge DO   On: 12/01/2019 15:20   CT Head Wo Contrast  Result Date:  12/01/2019 CLINICAL DATA:  Cough and fever with mental status changes EXAM: CT HEAD WITHOUT CONTRAST TECHNIQUE: Contiguous axial images were obtained from the base of the skull through the vertex without intravenous contrast. COMPARISON:  None. FINDINGS: Brain: No evidence of acute infarction, hemorrhage, hydrocephalus, extra-axial collection or mass lesion/mass effect. Chronic atrophic and ischemic changes are noted. Vascular: No hyperdense vessel or unexpected calcification. Skull: Normal. Negative for fracture or focal lesion. Sinuses/Orbits: Orbits and their contents are within normal limits. Air-fluid levels are noted within the sphenoid sinus with mucosal changes in the ethmoid sinuses consistent with sinusitis. Other: None IMPRESSION: Chronic atrophic and ischemic changes. No acute intracranial abnormality noted. Changes consistent with acute sinusitis as described. Electronically Signed   By: Alcide Clever M.D.   On: 12/01/2019 21:24      IMPRESSION AND PLAN:   1.  COVID-19 infection with associated gastroenteritis and dry cough as well as encephalopathy that could be related to COVID-19 encephalitis.  He has acute hypoxemic respiratory failure due to COVID-19. -The patient will be admitted to a medical monitored isolation bed. -He will be hydrated with IV normal saline. -We will place him on IV remdesivir and IV steroid therapy with Solu-Medrol. -We will check inflammatory markers and follow them. -O2 protocol will be  followed -We will check his D-dimer and if elevated will obtain a chest CTA with hydration and improvement of his creatinine. -His procalcitonin was less than 0.1 and therefore I will hold off on antibiotics. -I discussed the baricitinib with the patient and he agreed to proceed with it. -Mucolytic therapy will be provided. -The patient will be on vitamin D3, vitamin C, zinc sulfate and aspirin.  2.  Acute kidney injury. -This could be prerenal due to volume depletion and  dehydration. -We will be hydrated with IV normal saline and will follow his BMP.  3.  DVT prophylaxis. -Subcutaneous Lovenox    All the records are reviewed and case discussed with ED provider. The plan of care was discussed in details with the patient (and family). I answered all questions. The patient agreed to proceed with the above mentioned plan. Further management will depend upon hospital course.   CODE STATUS: Full code  Status is: Inpatient  Remains inpatient appropriate because:Altered mental status, Ongoing diagnostic testing needed not appropriate for outpatient work up, Unsafe d/c plan, IV treatments appropriate due to intensity of illness or inability to take PO and Inpatient level of care appropriate due to severity of illness   Dispo: The patient is from: Home              Anticipated d/c is to: Home              Anticipated d/c date is: 2 days              Patient currently is not medically stable to d/c.   TOTAL TIME TAKING CARE OF THIS PATIENT: 55 minutes.    Hannah Beat M.D on 12/02/2019 at 12:37 AM  [facility]   From 7 PM-7 AM, contact night-coverage [email or web address]  CC: Primary care physician; Patient, No Pcp Per

## 2019-12-02 NOTE — Hospital Course (Signed)
Dennis Wilkins  is a 79 y.o. Caucasian male with no known chronic medical problems, who presented to the ED on 12/01/19 with altered mental status with declining memory over the last couple of weeks with subjective fever and chills.  Son also reported nausea/vomiting and diarrhea, dry cough without shortness of breath.  Evaluation in the ED revealed Covid-19 infection, uncontrolled BP, oxygen sat with ambulation on room air was in mid-80's.  Patient treated in the ED with labetalol, IV fluids, IV steroid, and started on remdesivir.

## 2019-12-03 LAB — C-REACTIVE PROTEIN: CRP: 3.1 mg/dL — ABNORMAL HIGH (ref <1.0)

## 2019-12-03 LAB — CBC WITH DIFFERENTIAL/PLATELET
Abs Immature Granulocytes: 0.02 10*3/uL (ref 0.00–0.07)
Basophils Absolute: 0 10*3/uL (ref 0.0–0.1)
Basophils Relative: 0 %
Eosinophils Absolute: 0 10*3/uL (ref 0.0–0.5)
Eosinophils Relative: 0 %
HCT: 33.2 % — ABNORMAL LOW (ref 39.0–52.0)
Hemoglobin: 10.1 g/dL — ABNORMAL LOW (ref 13.0–17.0)
Immature Granulocytes: 0 %
Lymphocytes Relative: 19 %
Lymphs Abs: 1.1 10*3/uL (ref 0.7–4.0)
MCH: 23.6 pg — ABNORMAL LOW (ref 26.0–34.0)
MCHC: 30.4 g/dL (ref 30.0–36.0)
MCV: 77.6 fL — ABNORMAL LOW (ref 80.0–100.0)
Monocytes Absolute: 0.7 10*3/uL (ref 0.1–1.0)
Monocytes Relative: 11 %
Neutro Abs: 4.2 10*3/uL (ref 1.7–7.7)
Neutrophils Relative %: 70 %
Platelets: 230 10*3/uL (ref 150–400)
RBC: 4.28 MIL/uL (ref 4.22–5.81)
RDW: 20.2 % — ABNORMAL HIGH (ref 11.5–15.5)
WBC: 6 10*3/uL (ref 4.0–10.5)
nRBC: 0 % (ref 0.0–0.2)

## 2019-12-03 LAB — COMPREHENSIVE METABOLIC PANEL
ALT: 20 U/L (ref 0–44)
AST: 34 U/L (ref 15–41)
Albumin: 3.5 g/dL (ref 3.5–5.0)
Alkaline Phosphatase: 45 U/L (ref 38–126)
Anion gap: 9 (ref 5–15)
BUN: 33 mg/dL — ABNORMAL HIGH (ref 8–23)
CO2: 22 mmol/L (ref 22–32)
Calcium: 8.3 mg/dL — ABNORMAL LOW (ref 8.9–10.3)
Chloride: 108 mmol/L (ref 98–111)
Creatinine, Ser: 1.37 mg/dL — ABNORMAL HIGH (ref 0.61–1.24)
GFR, Estimated: 52 mL/min — ABNORMAL LOW (ref >60)
Glucose, Bld: 123 mg/dL — ABNORMAL HIGH (ref 70–99)
Potassium: 4.8 mmol/L (ref 3.5–5.1)
Sodium: 139 mmol/L (ref 135–145)
Total Bilirubin: 0.7 mg/dL (ref 0.3–1.2)
Total Protein: 6.9 g/dL (ref 6.5–8.1)

## 2019-12-03 LAB — FIBRIN DERIVATIVES D-DIMER (ARMC ONLY): Fibrin derivatives D-dimer (ARMC): 834.7 ng/mL (FEU) — ABNORMAL HIGH (ref 0.00–499.00)

## 2019-12-03 LAB — FERRITIN: Ferritin: 38 ng/mL (ref 24–336)

## 2019-12-03 MED ORDER — HYDROCOD POLST-CPM POLST ER 10-8 MG/5ML PO SUER: 5.0000 mL | Freq: Every evening | ORAL | Status: DC | PRN

## 2019-12-03 MED ORDER — CALCIUM CARBONATE ANTACID 500 MG PO CHEW: 1.0000 | CHEWABLE_TABLET | Freq: Three times a day (TID) | ORAL | Status: DC | PRN

## 2019-12-03 MED ORDER — SODIUM CHLORIDE 0.9 % IV SOLN: INTRAVENOUS | Status: DC

## 2019-12-03 MED ORDER — HYDRALAZINE HCL 50 MG PO TABS
25.0000 mg | ORAL_TABLET | Freq: Three times a day (TID) | ORAL | Status: DC | PRN
  Administered 2019-12-03: 23:00:00 25 mg via ORAL
  Filled 2019-12-03: qty 1

## 2019-12-03 NOTE — Progress Notes (Addendum)
PROGRESS NOTE    Dennis Wilkins   JGG:[id number]  DOB: 11/16/1940  PCP: Margaretann Loveless, MD    DOA: 12/01/2019 LOS: 2   Brief Narrative   Dennis Wilkins  is a 79 y.o. Caucasian male with no known chronic medical problems, who presented to the ED on 12/01/19 with altered mental status with declining memory over the last couple of weeks with subjective fever and chills.  Son also reported nausea/vomiting and diarrhea, dry cough without shortness of breath.  Evaluation in the ED revealed Covid-19 infection, uncontrolled BP, oxygen sat with ambulation on room air was in mid-80's.  Patient treated in the ED with labetalol, IV fluids, IV steroid, and started on remdesivir.     Assessment & Plan   Active Problems:   Encephalopathy   COVID-19   Covid-19 Infection with gastroenteritis - presented with GI symptoms and confusion, dry cough Acute encephalpathy due to Covid-19 - presented with confusion - improving Acute respiratory failure with hypoxia - ruled out - initially required supplemental oxygen, only briefly, since weaned off.  Was not short of breath, only respiratory symptom was mild coughing.   10/22: patient's confusion seems improved, GI symptoms also improving, feels weak and fatigued --Continue remdesevir (10/21 >> ) --Defer steroids since not hypoxic, and avoid possiblly worsening his confusion   --O2 if needed to keep spO2>90% --Follow inflammatory markers, CMP, CBC --Antibiotics deferred given negative procal on admission --Lovenox for VTE prophylaxis --Scheduled Mucinex, PRN Robitussin DM, PRN Tussionex at night --Vitamin C, zinc, vitamin D, ASA   Acute kidney injury - present on admission likely due to hypovolemia in setting of N/V/D, poor PO intake. Cr trend: 1.59 >> 1.21>>1.37.  Had resolved, but recurrent. Suspect still having poor PO intake. --Resume gentle IV fluids NS @ 75 cc/hr --BMP to monitor --Avoid nephrotoxins and hypotension  Microcytic anemia - suspect  iron deficiency.  Present on admission with Hbg 10.1, MCV 78.2. Hbg trend: 10.1>> 9.3>> 10.1 --Check anemia panel with AM labs tomorrow. --recommend screening colonoscopy as outpatient if not done --PCP follow up   DVT prophylaxis: enoxaparin (LOVENOX) injection 40 mg Start: 12/01/19 1000   Diet:  Diet Orders (From admission, onward)    Start     Ordered   12/02/19 0000  Diet Heart Room service appropriate? Yes; Fluid consistency: Thin  Diet effective now       Question Answer Comment  Room service appropriate? Yes   Fluid consistency: Thin      12/02/19 0002            Code Status: Full Code    Subjective 12/03/19    Pt seen this A at bedside.  He reports feeling somewhat better today.  Says his mind feels like it's clearing up.  Had some heartburn earlier today that resolved.  Currently has a mild headache.  Reports nausea and diarrhea improved.  Still with mild dry cough but no fever/chills or shortness of breath.   Disposition Plan & Communication   Status is: Inpatient  Remains inpatient appropriate because:IV treatments appropriate due to intensity of illness or inability to take PO.  Patient resumed on fluids for recurrent AKI today, still inadequate oral intake.  Expect d/c home in 1-2 days.   Dispo: The patient is from: Home              Anticipated d/c is to: Home              Anticipated d/c date is: 1-2  days              Patient currently is not medically stable to d/c.    Family Communication: spoke with patient's son, Thereasa Distance, by phone this afternoon 10/22.   Consults, Procedures, Significant Events   Consultants:   none  Procedures:   none  Antimicrobials:  Anti-infectives (From admission, onward)   Start     Dose/Rate Route Frequency Ordered Stop   12/02/19 1000  remdesivir 100 mg in sodium chloride 0.9 % 100 mL IVPB       "Followed by" Linked Group Details   100 mg 200 mL/hr over 30 Minutes Intravenous Daily 12/02/19 0002 12/06/19 0959     12/02/19 0015  remdesivir 200 mg in sodium chloride 0.9% 250 mL IVPB       "Followed by" Linked Group Details   200 mg 580 mL/hr over 30 Minutes Intravenous Once 12/02/19 0002 12/02/19 0249         Objective   Vitals:   12/02/19 2021 12/03/19 0642 12/03/19 0812 12/03/19 1215  BP: (!) 150/94 (!) 163/77 (!) 142/78 (!) 166/78  Pulse: 95 (!) 45 97 79  Resp: 20 17 18 18   Temp: 98.3 F (36.8 C) 98.5 F (36.9 C) 98.2 F (36.8 C) 98 F (36.7 C)  TempSrc: Oral Oral Oral Oral  SpO2: 94% 93% 94% 93%  Weight:      Height:        Intake/Output Summary (Last 24 hours) at 12/03/2019 1458 Last data filed at 12/02/2019 1738 Gross per 24 hour  Intake 100.02 ml  Output --  Net 100.02 ml   Filed Weights   12/01/19 1452  Weight: 81.6 kg    Physical Exam:  General exam: awake, alert, no acute distress, confusion appears resolved Respiratory system: CTAB, normal respiratory effort, on room air. Cardiovascular system: normal S1/S2, RRR, no pedal edema.   Gastrointestinal system: soft, NT, ND, +bowel sounds. Central nervous system: A&O x4. no gross focal neurologic deficits, normal speech Psychiatry: normal mood, congruent affect  Labs   Data Reviewed: I have personally reviewed following labs and imaging studies  CBC: Recent Labs  Lab 12/01/19 1504 12/02/19 0515 12/03/19 0459  WBC 6.5 5.2 6.0  NEUTROABS  --  4.1 4.2  HGB 10.1* 9.3* 10.1*  HCT 34.1* 31.1* 33.2*  MCV 78.2* 78.5* 77.6*  PLT 235 190 230   Basic Metabolic Panel: Recent Labs  Lab 12/01/19 1504 12/01/19 2034 12/02/19 0515 12/03/19 0459  NA 134*  --  135 139  K 4.2  --  4.2 4.8  CL 100  --  105 108  CO2 21*  --  19* 22  GLUCOSE 117*  --  119* 123*  BUN 32*  --  29* 33*  CREATININE 1.59*  --  1.21 1.37*  CALCIUM 8.2*  --  7.4* 8.3*  MG  --  2.6*  --   --    GFR: Estimated Creatinine Clearance: 42.3 mL/min (A) (by C-G formula based on SCr of 1.37 mg/dL (H)). Liver Function Tests: Recent Labs  Lab  12/01/19 1504 12/02/19 0515 12/03/19 0459  AST 32 29 34  ALT 16 16 20   ALKPHOS 47 46 45  BILITOT 0.9 0.8 0.7  PROT 7.8 6.7 6.9  ALBUMIN 4.1 3.5 3.5   No results for input(s): LIPASE, AMYLASE in the last 168 hours. Recent Labs  Lab 12/01/19 2220  AMMONIA 13   Coagulation Profile: No results for input(s): INR, PROTIME in the last 168 hours.  Cardiac Enzymes: No results for input(s): CKTOTAL, CKMB, CKMBINDEX, TROPONINI in the last 168 hours. BNP (last 3 results) No results for input(s): PROBNP in the last 8760 hours. HbA1C: No results for input(s): HGBA1C in the last 72 hours. CBG: No results for input(s): GLUCAP in the last 168 hours. Lipid Profile: No results for input(s): CHOL, HDL, LDLCALC, TRIG, CHOLHDL, LDLDIRECT in the last 72 hours. Thyroid Function Tests: Recent Labs    12/01/19 2220  TSH 2.176   Anemia Panel: Recent Labs    12/02/19 0515 12/03/19 0459  FERRITIN 30 38   Sepsis Labs: Recent Labs  Lab 12/01/19 2034 12/02/19 0104  PROCALCITON <0.10 <0.10    Recent Results (from the past 240 hour(s))  Respiratory Panel by RT PCR (Flu A&B, Covid) - Nasopharyngeal Swab     Status: Abnormal   Collection Time: 12/01/19 10:20 PM   Specimen: Nasopharyngeal Swab  Result Value Ref Range Status   SARS Coronavirus 2 by RT PCR POSITIVE (A) NEGATIVE Final    Comment: RESULT CALLED TO, READ BACK BY AND VERIFIED WITH: NOAH Koehn Salehi RN 2340 0/20/21 HNM (NOTE) SARS-CoV-2 target nucleic acids are DETECTED.  SARS-CoV-2 RNA is generally detectable in upper respiratory specimens  during the acute phase of infection. Positive results are indicative of the presence of the identified virus, but do not rule out bacterial infection or co-infection with other pathogens not detected by the test. Clinical correlation with patient history and other diagnostic information is necessary to determine patient infection status. The expected result is Negative.  Fact Sheet for  Patients:  https://www.moore.com/https://www.fda.gov/media/142436/download  Fact Sheet for Healthcare Providers: https://www.young.biz/https://www.fda.gov/media/142435/download  This test is not yet approved or cleared by the [place] FDA and  has been authorized for detection and/or diagnosis of SARS-CoV-2 by FDA under an Emergency Use Authorization (EUA).  This EUA will remain in effect (meaning this test can be u sed) for the duration of  the COVID-19 declaration under Section 564(b)(1) of the Act, 21 U.S.C. section 360bbb-3(b)(1), unless the authorization is terminated or revoked sooner.      Influenza A by PCR NEGATIVE NEGATIVE Final   Influenza B by PCR NEGATIVE NEGATIVE Final    Comment: (NOTE) The Xpert Xpress SARS-CoV-2/FLU/RSV assay is intended as an aid in  the diagnosis of influenza from Nasopharyngeal swab specimens and  should not be used as a sole basis for treatment. Nasal washings and  aspirates are unacceptable for Xpert Xpress SARS-CoV-2/FLU/RSV  testing.  Fact Sheet for Patients: https://www.moore.com/https://www.fda.gov/media/142436/download  Fact Sheet for Healthcare Providers: https://www.young.biz/https://www.fda.gov/media/142435/download  This test is not yet approved or cleared by the [place] FDA and  has been authorized for detection and/or diagnosis of SARS-CoV-2 by  FDA under an Emergency Use Authorization (EUA). This EUA will remain  in effect (meaning this test can be used) for the duration of the  Covid-19 declaration under Section 564(b)(1) of the Act, 21  U.S.C. section 360bbb-3(b)(1), unless the authorization is  terminated or revoked. Performed at [facility], [address] [place], [place] [address]       Imaging Studies   DG Chest 2 View  Result Date: 12/01/2019 CLINICAL DATA:  Shortness of breath. Additional history provided: Cough, fever, forgetfulness, symptoms for 1 week. EXAM: CHEST - 2 VIEW COMPARISON:  No pertinent prior exams are available for comparison. FINDINGS: Heart size within  normal limits. Aortic atherosclerosis. Minimal atelectasis at the left lung base. No appreciable airspace consolidation or pulmonary edema. No evidence of pleural effusion or  pneumothorax. No acute bony abnormality identified. IMPRESSION: Minimal atelectasis at the left lung base. Otherwise, there is no evidence of acute cardiopulmonary abnormality. Aortic Atherosclerosis (ICD10-I70.0). Electronically Signed   By: Jackey Loge DO   On: 12/01/2019 15:20   CT Head Wo Contrast  Result Date: 12/01/2019 CLINICAL DATA:  Cough and fever with mental status changes EXAM: CT HEAD WITHOUT CONTRAST TECHNIQUE: Contiguous axial images were obtained from the base of the skull through the vertex without intravenous contrast. COMPARISON:  None. FINDINGS: Brain: No evidence of acute infarction, hemorrhage, hydrocephalus, extra-axial collection or mass lesion/mass effect. Chronic atrophic and ischemic changes are noted. Vascular: No hyperdense vessel or unexpected calcification. Skull: Normal. Negative for fracture or focal lesion. Sinuses/Orbits: Orbits and their contents are within normal limits. Air-fluid levels are noted within the sphenoid sinus with mucosal changes in the ethmoid sinuses consistent with sinusitis. Other: None IMPRESSION: Chronic atrophic and ischemic changes. No acute intracranial abnormality noted. Changes consistent with acute sinusitis as described. Electronically Signed   By: Alcide Clever M.D.   On: 12/01/2019 21:24     Medications   Scheduled Meds: . vitamin C  500 mg Oral Daily  . aspirin EC  81 mg Oral Daily  . cholecalciferol  1,000 Units Oral Daily  . enoxaparin (LOVENOX) injection  40 mg Subcutaneous Q24H  . guaiFENesin  600 mg Oral BID  . labetalol  10 mg Intravenous Once  . ramelteon  8 mg Oral QHS  . zinc sulfate  220 mg Oral Daily   Continuous Infusions: . sodium chloride 75 mL/hr at 12/03/19 1030  . remdesivir 100 mg in NS 100 mL 100 mg (12/03/19 0816)       LOS: 2 days     Time spent: 30 minutes with > 50% spent in coordination of care and direct patient contact.    Pennie Banter, DO [facility]  12/03/2019, 2:58 PM    If 7PM-7AM, please contact night-coverage. How to contact the [facility] Attending or Consulting provider 7A - 7P or covering provider during after hours 7P -7A, for this patient?    1. Check the care team in [facility] and look for a) attending/consulting [facility] provider listed and b) the [facility] team listed 2. Log into [email or web address] and use [place] universal password to access. If you do not have the password, please contact the hospital operator. 3. Locate the [facility] provider you are looking for under [facility] and page to a number that you can be directly reached. 4. If you still have difficulty reaching the provider, please page the [facility] (Director on Call) for the Hospitalists listed on amion for assistance.

## 2019-12-03 NOTE — Care Management Important Message (Signed)
Important Message  Patient Details  Name: Dennis Wilkins MRN: [id number] Date of Birth: October 23, 1940   Medicare Important Message Given:  N/A - LOS <3 / Initial given by admissions     Dennis Wilkins 12/03/2019, 8:13 AM

## 2019-12-03 NOTE — TOC Progression Note (Signed)
Transition of Care [facility]) - Progression Note    Patient Details  Name: Dennis Wilkins MRN: [id number] Date of Birth: 1940/06/30  Transition of Care [facility]) CM/SW Contact  Allayne Butcher, RN Phone Number: 12/03/2019, 2:29 PM  Clinical Narrative:    New patient appointment scheduled at [facility] with Dr. Yves Dill on Thursday November 4th at 11am.     Expected Discharge Plan: Home w Home Health Services Barriers to Discharge: Continued Medical Work up  Expected Discharge Plan and Services Expected Discharge Plan: Home w Home Health Services   Discharge Planning Services: CM Consult Post Acute Care Choice: Home Health Living arrangements for the past 2 months: Apartment                           HH Arranged: RN, PT, OT [facility] Agency: [facility] Date HH Agency Contacted: 12/03/19 Time HH Agency Contacted: [phone] Representative spoke with at [facility] Agency: Feliberto Gottron   Social Determinants of Health (SDOH) Interventions    Readmission Risk Interventions No flowsheet data found.

## 2019-12-03 NOTE — TOC Initial Note (Signed)
Transition of Care [facility]) - Initial/Assessment Note    Patient Details  Name: Dennis Wilkins MRN: [id number] Date of Birth: 1941-01-21  Transition of Care [facility]) CM/SW Contact:    Allayne Butcher, RN Phone Number: 12/03/2019, 1:44 PM  Clinical Narrative:                 Patient brought in to the hospital for altered mental status, found to be COVID +.  RNCM was able to speak with patient via phone.  Patient reports that he is from home where he lives alone.  Patient reports that it is okay for [facility] team to reach out to his son, Thereasa Distance.  Patient is independent at home, he has a cane but does not need to use it.  Patient does not have a current PCP and wanted to think about who is wants to be set up with.   Patient also wanted to speak with his son before deciding on home health services.   RNCM spoke with Thereasa Distance via phone, Thereasa Distance thinks that home health services would be a good idea and choose [facility].  Thereasa Distance would also like for the patient to be set up with a PCP- TOC team will attempt to get and appointment within the next week.  RNCM will call [facility] to try and schedule.   Expected Discharge Plan: Home w Home Health Services Barriers to Discharge: Continued Medical Work up   Patient Goals and CMS Choice Patient states their goals for this hospitalization and ongoing recovery are:: to get over this stuff CMS Medicare.gov Compare Post Acute Care list provided to:: Patient Choice offered to / list presented to : Patient, Adult Children  Expected Discharge Plan and Services Expected Discharge Plan: Home w Home Health Services   Discharge Planning Services: CM Consult Post Acute Care Choice: Home Health Living arrangements for the past 2 months: Apartment                           HH Arranged: RN, PT, OT HH Agency: [facility] Date HH Agency Contacted: 12/03/19 Time HH Agency Contacted: 1338 Representative spoke with at [facility] Agency: Feliberto Gottron  Prior Living Arrangements/Services Living arrangements for the past 2 months: Apartment Lives with:: Self Patient language and need for interpreter reviewed:: Yes Do you feel safe going back to the place where you live?: Yes      Need for Family Participation in Patient Care: Yes (Comment) (COVID) Care giver support system in place?: Yes (comment) (son) Current home services: DME (cane) Criminal Activity/Legal Involvement Pertinent to Current Situation/Hospitalization: No - Comment as needed  Activities of Daily Living Home Assistive Devices/Equipment: None ADL Screening (condition at time of admission) Patient's cognitive ability adequate to safely complete daily activities?: Yes Is the patient deaf or have difficulty hearing?: No Does the patient have difficulty seeing, even when wearing glasses/contacts?: No Does the patient have difficulty concentrating, remembering, or making decisions?: No Patient able to express need for assistance with ADLs?: No Does the patient have difficulty dressing or bathing?: No Independently performs ADLs?: Yes (appropriate for developmental age) Does the patient have difficulty walking or climbing stairs?: No Weakness of Legs: Both Weakness of Arms/Hands: None  Permission Sought/Granted Permission sought to share information with : Case Manager, Family Supports, Other (comment) Permission granted to share information with : Yes, Verbal Permission Granted  Share Information with NAME: Thereasa Distance  Permission granted to share info  w [facility]  Permission granted to share info w Relationship: son     Emotional Assessment   Attitude/Demeanor/Rapport: Engaged Affect (typically observed): Accepting Orientation: : Oriented to Self, Oriented to Place, Oriented to  Time, Oriented to Situation Alcohol / Substance Use: Not Applicable Psych Involvement: No (comment)  Admission diagnosis:  Cough [R05.9] Encephalopathy [G93.40] Acute  encephalopathy [G93.40] Acute kidney injury (HCC) [N17.9] Altered mental status, unspecified altered mental status type [R41.82] Nausea and vomiting, intractability of vomiting not specified, unspecified vomiting type [R11.2] COVID-19 [U07.1] Patient Active Problem List   Diagnosis Date Noted  . Encephalopathy 12/01/2019  . COVID-19 12/01/2019   PCP:  Patient, No Pcp Per Pharmacy:  No Pharmacies Listed    Social Determinants of Health (SDOH) Interventions    Readmission Risk Interventions No flowsheet data found.

## 2019-12-04 LAB — VITAMIN B12: Vitamin B-12: 1852 pg/mL — ABNORMAL HIGH (ref 180–914)

## 2019-12-04 LAB — CBC WITH DIFFERENTIAL/PLATELET
Abs Immature Granulocytes: 0.08 10*3/uL — ABNORMAL HIGH (ref 0.00–0.07)
Basophils Absolute: 0 10*3/uL (ref 0.0–0.1)
Basophils Relative: 0 %
Eosinophils Absolute: 0 10*3/uL (ref 0.0–0.5)
Eosinophils Relative: 0 %
HCT: 32.8 % — ABNORMAL LOW (ref 39.0–52.0)
Hemoglobin: 9.8 g/dL — ABNORMAL LOW (ref 13.0–17.0)
Immature Granulocytes: 1 %
Lymphocytes Relative: 15 %
Lymphs Abs: 1.5 10*3/uL (ref 0.7–4.0)
MCH: 23.7 pg — ABNORMAL LOW (ref 26.0–34.0)
MCHC: 29.9 g/dL — ABNORMAL LOW (ref 30.0–36.0)
MCV: 79.2 fL — ABNORMAL LOW (ref 80.0–100.0)
Monocytes Absolute: 0.9 10*3/uL (ref 0.1–1.0)
Monocytes Relative: 9 %
Neutro Abs: 7.4 10*3/uL (ref 1.7–7.7)
Neutrophils Relative %: 75 %
Platelets: 227 10*3/uL (ref 150–400)
RBC: 4.14 MIL/uL — ABNORMAL LOW (ref 4.22–5.81)
RDW: 21 % — ABNORMAL HIGH (ref 11.5–15.5)
WBC: 9.9 10*3/uL (ref 4.0–10.5)
nRBC: 0 % (ref 0.0–0.2)

## 2019-12-04 LAB — COMPREHENSIVE METABOLIC PANEL
ALT: 18 U/L (ref 0–44)
AST: 29 U/L (ref 15–41)
Albumin: 3.3 g/dL — ABNORMAL LOW (ref 3.5–5.0)
Alkaline Phosphatase: 43 U/L (ref 38–126)
Anion gap: 8 (ref 5–15)
BUN: 32 mg/dL — ABNORMAL HIGH (ref 8–23)
CO2: 24 mmol/L (ref 22–32)
Calcium: 8.1 mg/dL — ABNORMAL LOW (ref 8.9–10.3)
Chloride: 107 mmol/L (ref 98–111)
Creatinine, Ser: 1.17 mg/dL (ref 0.61–1.24)
GFR, Estimated: >60 mL/min (ref >60)
Glucose, Bld: 97 mg/dL (ref 70–99)
Potassium: 4.7 mmol/L (ref 3.5–5.1)
Sodium: 139 mmol/L (ref 135–145)
Total Bilirubin: 0.7 mg/dL (ref 0.3–1.2)
Total Protein: 6.4 g/dL — ABNORMAL LOW (ref 6.5–8.1)

## 2019-12-04 LAB — RETICULOCYTES
Immature Retic Fract: 33.5 % — ABNORMAL HIGH (ref 2.3–15.9)
RBC.: 4.27 MIL/uL (ref 4.22–5.81)
Retic Count, Absolute: 55.1 10*3/uL (ref 19.0–186.0)
Retic Ct Pct: 1.3 % (ref 0.4–3.1)

## 2019-12-04 LAB — IRON AND TIBC
Iron: 16 ug/dL — ABNORMAL LOW (ref 45–182)
Saturation Ratios: 6 % — ABNORMAL LOW (ref 17.9–39.5)
TIBC: 274 ug/dL (ref 250–450)
UIBC: 258 ug/dL

## 2019-12-04 LAB — C-REACTIVE PROTEIN: CRP: 1.5 mg/dL — ABNORMAL HIGH (ref <1.0)

## 2019-12-04 LAB — FIBRIN DERIVATIVES D-DIMER (ARMC ONLY): Fibrin derivatives D-dimer (ARMC): 615.11 ng/mL (FEU) — ABNORMAL HIGH (ref 0.00–499.00)

## 2019-12-04 LAB — FERRITIN: Ferritin: 29 ng/mL (ref 24–336)

## 2019-12-04 LAB — FOLATE: Folate: 31 ng/mL (ref >5.9)

## 2019-12-04 MED ORDER — FERROUS SULFATE 325 (65 FE) MG PO TABS: 325.0000 mg | ORAL_TABLET | Freq: Every day | ORAL | Status: DC

## 2019-12-04 MED ORDER — ZINC SULFATE 220 (50 ZN) MG PO CAPS: 220.0000 mg | ORAL_CAPSULE | Freq: Every day | ORAL | 0 refills | Status: AC

## 2019-12-04 MED ORDER — GUAIFENESIN-DM 100-10 MG/5ML PO SYRP: 10.0000 mL | ORAL_SOLUTION | ORAL | 0 refills | Status: DC | PRN

## 2019-12-04 MED ORDER — MAGNESIUM CITRATE PO SOLN
1.0000 | Freq: Once | ORAL | Status: DC | PRN
  Filled 2019-12-04: qty 296

## 2019-12-04 MED ORDER — ASCORBIC ACID 500 MG PO TABS: 500.0000 mg | ORAL_TABLET | Freq: Every day | ORAL | Status: AC

## 2019-12-04 MED ORDER — FERROUS SULFATE 325 (65 FE) MG PO TABS: 325.0000 mg | ORAL_TABLET | Freq: Every day | ORAL | 3 refills | Status: AC

## 2019-12-04 MED ORDER — VITAMIN D3 25 MCG PO TABS
1000.0000 [IU] | ORAL_TABLET | Freq: Every day | ORAL | 0 refills | Status: DC
Start: 2019-12-05 — End: 2023-04-10

## 2019-12-04 MED ORDER — ACETAMINOPHEN 325 MG PO TABS: 650.0000 mg | ORAL_TABLET | Freq: Four times a day (QID) | ORAL | Status: DC | PRN

## 2019-12-04 MED ORDER — BISACODYL 5 MG PO TBEC
5.0000 mg | DELAYED_RELEASE_TABLET | Freq: Every day | ORAL | Status: DC | PRN
  Administered 2019-12-04: 13:00:00 5 mg via ORAL
  Filled 2019-12-04: qty 1

## 2019-12-04 MED ORDER — POLYETHYLENE GLYCOL 3350 17 G PO PACK
17.0000 g | PACK | Freq: Every day | ORAL | Status: DC
  Administered 2019-12-04: 17 g via ORAL
  Filled 2019-12-04: qty 1

## 2019-12-04 MED ORDER — SODIUM CHLORIDE 0.9 % IV SOLN
300.0000 mg | Freq: Once | INTRAVENOUS | Status: AC
  Administered 2019-12-04: 300 mg via INTRAVENOUS
  Filled 2019-12-04: qty 15

## 2019-12-04 NOTE — Discharge Summary (Signed)
Physician Discharge Summary  SARGENT MANKEY NWG:[id number] DOB: 04/30/1940 DOA: 12/01/2019  PCP: Margaretann Loveless, MD  Admit date: 12/01/2019 Discharge date: 12/04/2019  Admitted From: home Disposition:  home  Recommendations for Outpatient Follow-up:  1. Follow up with PCP in 1-2 weeks 2. Please obtain BMP/CBC in one week 3. Please follow up on patient's iron deficiency anemia and consider further evaluation into etiology including endoscopy  Home Health: PT, OT, RN  Equipment/Devices: none   Discharge Condition: stable  CODE STATUS: full  Diet recommendation: Heart Healthy   Discharge Diagnoses: Active Problems:   Encephalopathy   COVID-19    Summary of HPI and Hospital Course:  Dennis Wilkins  is a 79 y.o. Caucasian male with no known chronic medical problems, who presented to the ED on 12/01/19 with altered mental status with declining memory over the last couple of weeks with subjective fever and chills.  Son also reported nausea/vomiting and diarrhea, dry cough without shortness of breath.  Evaluation in the ED revealed Covid-19 infection, uncontrolled BP, oxygen sat with ambulation on room air was in mid-80's.  Patient treated in the ED with labetalol, IV fluids, IV steroid, and started on remdesivir.    Covid-19 Infection with gastroenteritis - presented with GI symptoms and confusion, dry cough Acute encephalpathy due to Covid-19 - presented with confusion - improving Acute respiratory failure with hypoxia - ruled out - initially required supplemental oxygen, only briefly, since weaned off.  Was not short of breath, only respiratory symptom was mild coughing.   10/22: patient's confusion seems improved, GI symptoms also improving, feels weak and fatigued.  Patient's son stated patient sounds at baseline when talking to him on phone. 10/23: patient stable for discharge, confusion resolved, GI symptoms resolved --Treated with remdesevir --Not given steroids since not hypoxic, and  wanted to avoid possiblly worsening his confusion   --Inflammatory markers are trending down.  CRP 4.0>>3.1<<1.5 --Antibiotics deferred given negative procal on admission --Lovenox was used for VTE prophylaxis --Cough treated with Scheduled Mucinex, PRN Robitussin DM, PRN Tussionex at night --Vitamin C, zinc, vitamin D, ASA  Acute kidney injury - resolved with IV hydration.  Present on admission likely due to hypovolemia in setting of N/V/D, poor PO intake.  Cr trend: 1.59 >> 1.21>>1.37>>1.17.  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   Hbg trend: 10.1>> 9.3>> 10.1>>9.8.  Anemia panel reflects iron deficiency.  Folate normal, vitamin B12 elevated. Given iron infusion today prior to discharge and prescribed daily oral iron supplement. --recommend screening colonoscopy as outpatient if not done --PCP follow up   Discharge Instructions   Discharge Instructions    Diet - low sodium heart healthy   Complete by: As directed    Discharge instructions   Complete by: As directed    You were treated for Covid-19 infection with mainly gastrointestinal symptoms, you did not have pneumonia fortunately. You were treated with remdesivir during your stay.  I recommend taking vitamins (in medication list below) as you continue to recover from Covid-19.    You are mildly anemic (hemoglobin slightly low), with iron deficiency.  I gave you an iron infusion today before discharge, and sent prescription for iron tablets to take each day with breakfast.  Your primary care doctor will follow up on this, and may recommend having a colonoscopy if not done for screening recently.  The most common cause of iron deficiency is slow blood loss  from the GI tract.   Increase activity slowly   Complete by: As directed      Allergies as of 12/04/2019   No Known Allergies     Medication List    TAKE these medications   acetaminophen  325 MG tablet Commonly known as: TYLENOL Take 2 tablets (650 mg total) by mouth every 6 (six) hours as needed for mild pain (or Fever >/= 101).   ascorbic acid 500 MG tablet Commonly known as: VITAMIN C Take 1 tablet (500 mg total) by mouth daily. Start taking on: December 05, 2019   ferrous sulfate 325 (65 FE) MG tablet Take 1 tablet (325 mg total) by mouth daily with breakfast. Start taking on: December 05, 2019   guaiFENesin-dextromethorphan 100-10 MG/5ML syrup Commonly known as: ROBITUSSIN DM Take 10 mLs by mouth every 4 (four) hours as needed for cough.   Vitamin D3 25 MCG tablet Commonly known as: Vitamin D Take 1 tablet (1,000 Units total) by mouth daily. Start taking on: December 05, 2019   zinc sulfate 220 (50 Zn) MG capsule Take 1 capsule (220 mg total) by mouth daily. Start taking on: December 05, 2019       Follow-up Information    Margaretann Loveless, MD. Go on 12/16/2019.   Specialty: Internal Medicine Why: Arrive at 1030, bring ID and proof of insurance Contact information: [address] [place] [address] [phone]              No Known Allergies  Consultations:  none    Procedures/Studies: DG Chest 2 View  Result Date: 12/01/2019 CLINICAL DATA:  Shortness of breath. Additional history provided: Cough, fever, forgetfulness, symptoms for 1 week. EXAM: CHEST - 2 VIEW COMPARISON:  No pertinent prior exams are available for comparison. FINDINGS: Heart size within normal limits. Aortic atherosclerosis. Minimal atelectasis at the left lung base. No appreciable airspace consolidation or pulmonary edema. No evidence of pleural effusion or pneumothorax. No acute bony abnormality identified. IMPRESSION: Minimal atelectasis at the left lung base. Otherwise, there is no evidence of acute cardiopulmonary abnormality. Aortic Atherosclerosis (ICD10-I70.0). Electronically Signed   By: Jackey Loge DO   On: 12/01/2019 15:20   CT Head Wo Contrast  Result Date:  12/01/2019 CLINICAL DATA:  Cough and fever with mental status changes EXAM: CT HEAD WITHOUT CONTRAST TECHNIQUE: Contiguous axial images were obtained from the base of the skull through the vertex without intravenous contrast. COMPARISON:  None. FINDINGS: Brain: No evidence of acute infarction, hemorrhage, hydrocephalus, extra-axial collection or mass lesion/mass effect. Chronic atrophic and ischemic changes are noted. Vascular: No hyperdense vessel or unexpected calcification. Skull: Normal. Negative for fracture or focal lesion. Sinuses/Orbits: Orbits and their contents are within normal limits. Air-fluid levels are noted within the sphenoid sinus with mucosal changes in the ethmoid sinuses consistent with sinusitis. Other: None IMPRESSION: Chronic atrophic and ischemic changes. No acute intracranial abnormality noted. Changes consistent with acute sinusitis as described. Electronically Signed   By: Alcide Clever M.D.   On: 12/01/2019 21:24       Subjective: Pt seen this AM and states he is feeling better.  Had some slight nausea again this AM, no vomiting and was able to eat breakfast and tolerated.  Reports constipation, stool softeners given.  Denies chest pain, shortness of breath, fever or chills.  States cough is improving.   Discharge Exam: Vitals:   12/04/19 0735 12/04/19 1159  BP: (!) 145/68 (!) 143/80  Pulse: 82 88  Resp: 20 18  Temp: 98.4 F (36.9 C) 98.5 F (36.9 C)  SpO2: 95% 97%   Vitals:   12/04/19 0000 12/04/19 0400 12/04/19 0735 12/04/19 1159  BP: (!) 167/54 (!) 145/60 (!) 145/68 (!) 143/80  Pulse: 83 80 82 88  Resp: 19 18 20 18   Temp: 97.9 F (36.6 C) 98 F (36.7 C) 98.4 F (36.9 C) 98.5 F (36.9 C)  TempSrc: Axillary Axillary Oral Oral  SpO2: 94% 95% 95% 97%  Weight:      Height:        General: Pt is alert, awake, not in acute distress Cardiovascular: RRR, S1/S2 +, no rubs, no gallops Respiratory: CTA bilaterally, no wheezing, no rhonchi Abdominal: Soft,  NT, ND, bowel sounds + Extremities: no edema, no cyanosis    The results of significant diagnostics from this hospitalization (including imaging, microbiology, ancillary and laboratory) are listed below for reference.     Microbiology: Recent Results (from the past 240 hour(s))  Respiratory Panel by RT PCR (Flu A&B, Covid) - Nasopharyngeal Swab     Status: Abnormal   Collection Time: 12/01/19 10:20 PM   Specimen: Nasopharyngeal Swab  Result Value Ref Range Status   SARS Coronavirus 2 by RT PCR POSITIVE (A) NEGATIVE Final    Comment: RESULT CALLED TO, READ BACK BY AND VERIFIED WITH: NOAH Rennae Ferraiolo RN 2340 0/20/21 HNM (NOTE) SARS-CoV-2 target nucleic acids are DETECTED.  SARS-CoV-2 RNA is generally detectable in upper respiratory specimens  during the acute phase of infection. Positive results are indicative of the presence of the identified virus, but do not rule out bacterial infection or co-infection with other pathogens not detected by the test. Clinical correlation with patient history and other diagnostic information is necessary to determine patient infection status. The expected result is Negative.  Fact Sheet for Patients:  03-20-2006  Fact Sheet for Healthcare Providers: https://www.moore.com/  This test is not yet approved or cleared by the https://www.young.biz/ FDA and  has been authorized for detection and/or diagnosis of SARS-CoV-2 by FDA under an Emergency Use Authorization (EUA).  This EUA will remain in effect (meaning this test can be u sed) for the duration of  the COVID-19 declaration under Section 564(b)(1) of the Act, 21 U.S.C. section 360bbb-3(b)(1), unless the authorization is terminated or revoked sooner.      Influenza A by PCR NEGATIVE NEGATIVE Final   Influenza B by PCR NEGATIVE NEGATIVE Final    Comment: (NOTE) The Xpert Xpress SARS-CoV-2/FLU/RSV assay is intended as an aid in  the diagnosis of  influenza from Nasopharyngeal swab specimens and  should not be used as a sole basis for treatment. Nasal washings and  aspirates are unacceptable for Xpert Xpress SARS-CoV-2/FLU/RSV  testing.  Fact Sheet for Patients: [place]  Fact Sheet for Healthcare Providers: https://www.moore.com/  This test is not yet approved or cleared by the https://www.young.biz/ FDA and  has been authorized for detection and/or diagnosis of SARS-CoV-2 by  FDA under an Emergency Use Authorization (EUA). This EUA will remain  in effect (meaning this test can be used) for the duration of the  Covid-19 declaration under Section 564(b)(1) of the Act, 21  U.S.C. section 360bbb-3(b)(1), unless the authorization is  terminated or revoked. Performed at [facility], [address] [place], [place]      Labs: BNP (last 3 results) Recent Labs    12/01/19 2037  BNP 23.7   Basic Metabolic Panel: Recent Labs  Lab 12/01/19 1504 12/01/19 2034 12/02/19 0515 12/03/19 0459 12/04/19  0525  NA 134*  --  135 139 139  K 4.2  --  4.2 4.8 4.7  CL 100  --  105 108 107  CO2 21*  --  19* 22 24  GLUCOSE 117*  --  119* 123* 97  BUN 32*  --  29* 33* 32*  CREATININE 1.59*  --  1.21 1.37* 1.17  CALCIUM 8.2*  --  7.4* 8.3* 8.1*  MG  --  2.6*  --   --   --    Liver Function Tests: Recent Labs  Lab 12/01/19 1504 12/02/19 0515 12/03/19 0459 12/04/19 0525  AST 32 29 34 29  ALT 16 16 20 18   ALKPHOS 47 46 45 43  BILITOT 0.9 0.8 0.7 0.7  PROT 7.8 6.7 6.9 6.4*  ALBUMIN 4.1 3.5 3.5 3.3*   No results for input(s): LIPASE, AMYLASE in the last 168 hours. Recent Labs  Lab 12/01/19 2220  AMMONIA 13   CBC: Recent Labs  Lab 12/01/19 1504 12/02/19 0515 12/03/19 0459 12/04/19 0525  WBC 6.5 5.2 6.0 9.9  NEUTROABS  --  4.1 4.2 7.4  HGB 10.1* 9.3* 10.1* 9.8*  HCT 34.1* 31.1* 33.2* 32.8*  MCV 78.2* 78.5* 77.6* 79.2*  PLT 235 190 230 227   Cardiac  Enzymes: No results for input(s): CKTOTAL, CKMB, CKMBINDEX, TROPONINI in the last 168 hours. BNP: Invalid input(s): POCBNP CBG: No results for input(s): GLUCAP in the last 168 hours. D-Dimer No results for input(s): DDIMER in the last 72 hours. Hgb A1c No results for input(s): HGBA1C in the last 72 hours. Lipid Profile No results for input(s): CHOL, HDL, LDLCALC, TRIG, CHOLHDL, LDLDIRECT in the last 72 hours. Thyroid function studies Recent Labs    12/01/19 2220  TSH 2.176   Anemia work up Recent Labs    12/03/19 0459 12/04/19 0525  VITAMINB12  --  1,852*  FOLATE  --  31.0  FERRITIN 38 29  TIBC  --  274  IRON  --  16*  RETICCTPCT  --  1.3   Urinalysis    Component Value Date/Time   COLORURINE YELLOW (A) 12/02/2019 0104   APPEARANCEUR HAZY (A) 12/02/2019 0104   LABSPEC 1.023 12/02/2019 0104   PHURINE 5.0 12/02/2019 0104   GLUCOSEU NEGATIVE 12/02/2019 0104   HGBUR NEGATIVE 12/02/2019 0104   BILIRUBINUR NEGATIVE 12/02/2019 0104   KETONESUR 20 (A) 12/02/2019 0104   PROTEINUR 30 (A) 12/02/2019 0104   NITRITE NEGATIVE 12/02/2019 0104   LEUKOCYTESUR NEGATIVE 12/02/2019 0104   Sepsis Labs Invalid input(s): PROCALCITONIN,  WBC,  LACTICIDVEN Microbiology Recent Results (from the past 240 hour(s))  Respiratory Panel by RT PCR (Flu A&B, Covid) - Nasopharyngeal Swab     Status: Abnormal   Collection Time: 12/01/19 10:20 PM   Specimen: Nasopharyngeal Swab  Result Value Ref Range Status   SARS Coronavirus 2 by RT PCR POSITIVE (A) NEGATIVE Final    Comment: RESULT CALLED TO, READ BACK BY AND VERIFIED WITH: NOAH Eean Buss RN 2340 0/20/21 HNM (NOTE) SARS-CoV-2 target nucleic acids are DETECTED.  SARS-CoV-2 RNA is generally detectable in upper respiratory specimens  during the acute phase of infection. Positive results are indicative of the presence of the identified virus, but do not rule out bacterial infection or co-infection with other pathogens not detected by the  test. Clinical correlation with patient history and other diagnostic information is necessary to determine patient infection status. The expected result is Negative.  Fact Sheet for Patients:  https://www.moore.com/https://www.fda.gov/media/142436/download  Fact Sheet for  Healthcare Providers: https://www.young.biz/  This test is not yet approved or cleared by the [place] and  has been authorized for detection and/or diagnosis of SARS-CoV-2 by FDA under an Emergency Use Authorization (EUA).  This EUA will remain in effect (meaning this test can be u sed) for the duration of  the COVID-19 declaration under Section 564(b)(1) of the Act, 21 U.S.C. section 360bbb-3(b)(1), unless the authorization is terminated or revoked sooner.      Influenza A by PCR NEGATIVE NEGATIVE Final   Influenza B by PCR NEGATIVE NEGATIVE Final    Comment: (NOTE) The Xpert Xpress SARS-CoV-2/FLU/RSV assay is intended as an aid in  the diagnosis of influenza from Nasopharyngeal swab specimens and  should not be used as a sole basis for treatment. Nasal washings and  aspirates are unacceptable for Xpert Xpress SARS-CoV-2/FLU/RSV  testing.  Fact Sheet for Patients: https://www.moore.com/  Fact Sheet for Healthcare Providers: https://www.young.biz/  This test is not yet approved or cleared by the [place] FDA and  has been authorized for detection and/or diagnosis of SARS-CoV-2 by  FDA under an Emergency Use Authorization (EUA). This EUA will remain  in effect (meaning this test can be used) for the duration of the  Covid-19 declaration under Section 564(b)(1) of the Act, 21  U.S.C. section 360bbb-3(b)(1), unless the authorization is  terminated or revoked. Performed at [facility], [address] [place], [place] [address]      Time coordinating discharge: Over 30 minutes  SIGNED:   Pennie Banter, DO [facility] 12/04/2019,  1:25 PM   If 7PM-7AM, please contact night-coverage [email or web address]

## 2019-12-04 NOTE — Plan of Care (Signed)
  Problem: Education: Goal: Knowledge of risk factors and measures for prevention of condition will improve Outcome: Adequate for Discharge   Problem: Respiratory: Goal: Will maintain a patent airway Outcome: Adequate for Discharge   Problem: Education: Goal: Knowledge of General Education information will improve Description: Including pain rating scale, medication(s)/side effects and non-pharmacologic comfort measures Outcome: Adequate for Discharge   Problem: Health Behavior/Discharge Planning: Goal: Ability to manage health-related needs will improve Outcome: Adequate for Discharge   Problem: Clinical Measurements: Goal: Ability to maintain clinical measurements within normal limits will improve Outcome: Adequate for Discharge Goal: Will remain free from infection Outcome: Adequate for Discharge Goal: Diagnostic test results will improve Outcome: Adequate for Discharge Goal: Respiratory complications will improve Outcome: Adequate for Discharge Goal: Cardiovascular complication will be avoided Outcome: Adequate for Discharge   Problem: Nutrition: Goal: Adequate nutrition will be maintained Outcome: Adequate for Discharge

## 2019-12-04 NOTE — Progress Notes (Signed)
MD order received in [facility] to discharge pt home with home health services today; TOC previously established Home Health RN, PT and OT services with [facility]; verbally reviewed AVS with pt, Rxs escribed to [facility]; no questions voiced at this time; pt's discharge pending arrival of his son at the [facility] entrance; verbally instructed pt to call out on the nurse callbell when his son arrives in order to take him out for discharge

## 2019-12-04 NOTE — Progress Notes (Signed)
Pt's son present at the [facility] entrance; pt discharged via wheelchair by nursing to the visitor's entrance

## 2019-12-04 NOTE — TOC Transition Note (Signed)
Transition of Care [facility]) - CM/SW Discharge Note   Patient Details  Name: Dennis Wilkins MRN: [id number] Date of Birth: 09-Oct-1940  Transition of Care [facility]) CM/SW Contact:  Allayne Butcher, RN Phone Number: 12/04/2019, 1:39 PM   Clinical Narrative:    Patient is medically cleared to discharge home with home health services.  [facility] and Barbara Cower with [facility] is aware of discharge today.  Dr. Judithann Sheen has agreed to write home health orders until patient gets in to see Dr. Welton Flakes on Nov 4th.  Patient's son will be able to pick him up today.  Bedside RN will call son when patient is ready for DC.     Final next level of care: Home w Home Health Services Barriers to Discharge: Barriers Resolved   Patient Goals and CMS Choice Patient states their goals for this hospitalization and ongoing recovery are:: to get over this stuff CMS Medicare.gov Compare Post Acute Care list provided to:: Patient Choice offered to / list presented to : Patient, Adult Children  Discharge Placement                       Discharge Plan and Services   Discharge Planning Services: CM Consult Post Acute Care Choice: Home Health                    HH Arranged: RN, PT, OT [facility] Agency: [facility] Date [facility] Agency Contacted: 12/04/19 Time HH Agency Contacted: [phone] Representative spoke with at [facility] Agency: Feliberto Gottron  Social Determinants of Health (SDOH) Interventions     Readmission Risk Interventions No flowsheet data found.

## 2019-12-04 NOTE — Discharge Instructions (Signed)
[facility] Registered Nurse, Physical Therapy and Occupational Therapy services

## 2020-01-12 ENCOUNTER — Other Ambulatory Visit: Payer: Self-pay

## 2020-01-12 ENCOUNTER — Encounter: Payer: Self-pay | Admitting: Urology

## 2020-01-12 ENCOUNTER — Ambulatory Visit (INDEPENDENT_AMBULATORY_CARE_PROVIDER_SITE_OTHER): Payer: Medicare Other | Admitting: Urology

## 2020-01-12 VITALS — BP 171/82 | HR 99 | Ht 69.0 in | Wt 203.0 lb

## 2020-01-12 DIAGNOSIS — R972 Elevated prostate specific antigen [PSA]: Secondary | ICD-10-CM

## 2020-01-12 DIAGNOSIS — N401 Enlarged prostate with lower urinary tract symptoms: Secondary | ICD-10-CM

## 2020-01-12 DIAGNOSIS — N138 Other obstructive and reflux uropathy: Secondary | ICD-10-CM | POA: Diagnosis not present

## 2020-01-12 LAB — BLADDER SCAN AMB NON-IMAGING

## 2020-01-12 MED ORDER — TAMSULOSIN HCL 0.4 MG PO CAPS: 0.4000 mg | ORAL_CAPSULE | Freq: Every day | ORAL | 3 refills | Status: DC

## 2020-01-12 NOTE — Progress Notes (Signed)
01/12/2020 10:47 AM   Dennis Wilkins 03-Aug-1940 [id number]  Referring provider: Margaretann Loveless, MD [address] [place],  [place] [address]  Chief Complaint  Patient presents with  . Elevated PSA    New Patient    HPI: 79 year old male referred for further evaluation of elevated PSA.  Notably, he was recently admitted in late October with severe COVID-19 infection.  He required supportive care but ultimately recovered.  He was followed up at his primary care physicians for panel of labs were ordered for routine evaluation.  His PSA was incidentally noted to be 10.3 on 12/17/2019.  Creatinine 1.34.  We have no previous PSAs for eval.  PVR 55  IPSS as outlined below.  He does have some significant baseline urinary symptoms.     IPSS    Row Name 01/12/20 1000         International Prostate Symptom Score   How often have you had the sensation of not emptying your bladder? Almost always     How often have you had to urinate less than every two hours? Less than half the time     How often have you found you stopped and started again several times when you urinated? Less than half the time     How often have you found it difficult to postpone urination? About half the time     How often have you had a weak urinary stream? More than half the time     How often have you had to strain to start urination? Less than half the time     How many times did you typically get up at night to urinate? 5 Times     Total IPSS Score 23       Quality of Life due to urinary symptoms   If you were to spend the rest of your life with your urinary condition just the way it is now how would you feel about that? Mixed            Score:  1-7 Mild 8-19 Moderate 20-35 Severe    PMH: Past Medical History:  Diagnosis Date  . COVID-19 12/01/2019   diagnosed at [facility] on 12/01/2019  . Dyspnea   . Encephalopathy     Surgical History: Past Surgical History:  Procedure Laterality Date  .  BACK SURGERY     lower lumbar    Home Medications:  Allergies as of 01/12/2020   No Known Allergies     Medication List       Accurate as of January 12, 2020 10:47 AM. If you have any questions, ask your nurse or doctor.        acetaminophen 325 MG tablet Commonly known as: TYLENOL Take 2 tablets (650 mg total) by mouth every 6 (six) hours as needed for mild pain (or Fever >/= 101).   amLODipine 5 MG tablet Commonly known as: NORVASC Take 5 mg by mouth daily.   ascorbic acid 500 MG tablet Commonly known as: VITAMIN C Take 1 tablet (500 mg total) by mouth daily.   ferrous sulfate 325 (65 FE) MG tablet Take 1 tablet (325 mg total) by mouth daily with breakfast.   guaiFENesin-dextromethorphan 100-10 MG/5ML syrup Commonly known as: ROBITUSSIN DM Take 10 mLs by mouth every 4 (four) hours as needed for cough.   Vitamin D3 25 MCG tablet Commonly known as: Vitamin D Take 1 tablet (1,000 Units total) by mouth daily.   zinc sulfate 220 (  50 Zn) MG capsule Take 1 capsule (220 mg total) by mouth daily.       Allergies: No Known Allergies  Family History: Family History  Problem Relation Age of Onset  . Prostate cancer Neg Hx   . Bladder Cancer Neg Hx   . Kidney cancer Neg Hx     Social History:  reports that he has quit smoking. He has never used smokeless tobacco. He reports previous alcohol use. He reports that he does not use drugs.   Physical Exam: BP (!) 171/82   Pulse 99   Ht 5\' 9"  (1.753 m)   Wt 203 lb (92.1 kg)   BMI 29.98 kg/m   Constitutional:  Alert and oriented, No acute distress. HEENT: [place] AT, moist mucus membranes.  Trachea midline, no masses. Cardiovascular: No clubbing, cyanosis, or edema. Respiratory: Normal respiratory effort, no increased work of breathing. GI: Abdomen is soft, nontender, nondistended, no abdominal masses Rectal: Normal sphincter tone.  Enlarged prostate, nontender, no obvious nodules, 50+ cc.  Normal sphincter tone. Skin:  No rashes, bruises or suspicious lesions. Neurologic: Grossly intact, no focal deficits, moving all 4 extremities. Psychiatric: Normal mood and affect.  Laboratory Data: Lab Results  Component Value Date   WBC 9.9 12/04/2019   HGB 9.8 (L) 12/04/2019   HCT 32.8 (L) 12/04/2019   MCV 79.2 (L) 12/04/2019   PLT 227 12/04/2019    Lab Results  Component Value Date   CREATININE 1.17 12/04/2019   PSA as above  Pertinent Imaging: Results for orders placed or performed in visit on 01/12/20  Microscopic Examination   Urine  Result Value Ref Range   WBC, UA 0-5 0 - 5 /hpf   RBC 0-2 0 - 2 /hpf   Epithelial Cells (non renal) 0-10 0 - 10 /hpf   Casts Present (A) None seen /lpf   Cast Type Hyaline casts N/A   Mucus, UA Present (A) Not Estab.   Bacteria, UA None seen None seen/Few  PSA  Result Value Ref Range   Prostate Specific Ag, Serum 7.9 (H) 0.0 - 4.0 ng/mL  Urinalysis, Complete  Result Value Ref Range   Specific Gravity, UA 1.025 1.005 - 1.030   pH, UA 5.5 5.0 - 7.5   Color, UA Orange Yellow   Appearance Ur Hazy (A) Clear   Leukocytes,UA Negative Negative   Protein,UA Negative Negative/Trace   Glucose, UA Negative Negative   Ketones, UA 1+ (A) Negative   RBC, UA Negative Negative   Bilirubin, UA Negative Negative   Urobilinogen, Ur 0.2 0.2 - 1.0 mg/dL   Nitrite, UA Negative Negative   Microscopic Examination See below:   BLADDER SCAN AMB NON-IMAGING  Result Value Ref Range   Scan Result 68ml     Assessment & Plan:    1. Elevated PSA  We reviewed the implications of an elevated PSA and the uncertainty surrounding it. In general, a man's PSA increases with age and is produced by both normal and cancerous prostate tissue. Differential for elevated PSA is BPH, prostate cancer, infection, recent intercourse/ejaculation, prostate infarction, recent urethroscopic manipulation (foley placement/cystoscopy) and prostatitis. Management of an elevated PSA can include observation or  prostate biopsy and wediscussed this in detail. We discussed that indications for prostate biopsy are defined by age and race specific PSA cutoffs as well as a PSA velocity of 0.75/year.  We discussed that in general, would not recommend checking a PSA on any patient in his age group.  He is beyond the recommended  screening guidelines.  He does have a significant enlarged prostate and recent systemic inflammation from his COVID-19 diagnosis.  We will plan to recheck his PSA today.  We recommended consideration of trending his PSA and considering biopsy versus MRI if it continues to trend upwards.  He is agreeable this plan.  Plan for watchful waiting. - PSA; Future - PSA - BLADDER SCAN AMB NON-IMAGING - Urinalysis, Complete - PSA; Future  2. BPH with urinary obstruction Significant urinary symptoms, adequate emptying  We discussed trial of medication including Flomax.  We discussed possible side effects.  He like to try this for the next month and then follow-up with [place] to reassess.  fU 3 months with PSA/ IPSS/ PVR Vanna Scotland, MD  [facility] [address], [address] [place], [place] [address] [phone]

## 2020-01-13 LAB — PSA: Prostate Specific Ag, Serum: 7.9 ng/mL — ABNORMAL HIGH (ref 0.0–4.0)

## 2020-01-14 LAB — URINALYSIS, COMPLETE
Bilirubin, UA: NEGATIVE
Glucose, UA: NEGATIVE
Leukocytes,UA: NEGATIVE
Nitrite, UA: NEGATIVE
Protein,UA: NEGATIVE
RBC, UA: NEGATIVE
Specific Gravity, UA: 1.025 (ref 1.005–1.030)
Urobilinogen, Ur: 0.2 mg/dL (ref 0.2–1.0)
pH, UA: 5.5 (ref 5.0–7.5)

## 2020-01-14 LAB — MICROSCOPIC EXAMINATION: Bacteria, UA: NONE SEEN

## 2020-04-04 ENCOUNTER — Other Ambulatory Visit: Payer: Self-pay

## 2020-04-04 DIAGNOSIS — R972 Elevated prostate specific antigen [PSA]: Secondary | ICD-10-CM

## 2020-04-10 ENCOUNTER — Other Ambulatory Visit: Payer: Medicare Other

## 2020-04-11 ENCOUNTER — Other Ambulatory Visit: Payer: Medicare Other

## 2020-04-11 ENCOUNTER — Other Ambulatory Visit: Payer: Self-pay

## 2020-04-11 DIAGNOSIS — R972 Elevated prostate specific antigen [PSA]: Secondary | ICD-10-CM

## 2020-04-12 ENCOUNTER — Ambulatory Visit (INDEPENDENT_AMBULATORY_CARE_PROVIDER_SITE_OTHER): Payer: Medicare Other | Admitting: Urology

## 2020-04-12 VITALS — BP 163/77 | HR 78 | Ht 69.0 in | Wt 220.0 lb

## 2020-04-12 DIAGNOSIS — N401 Enlarged prostate with lower urinary tract symptoms: Secondary | ICD-10-CM | POA: Diagnosis not present

## 2020-04-12 DIAGNOSIS — N138 Other obstructive and reflux uropathy: Secondary | ICD-10-CM

## 2020-04-12 DIAGNOSIS — R972 Elevated prostate specific antigen [PSA]: Secondary | ICD-10-CM

## 2020-04-12 LAB — PSA: Prostate Specific Ag, Serum: 8 ng/mL — ABNORMAL HIGH (ref 0.0–4.0)

## 2020-04-12 NOTE — Progress Notes (Signed)
04/12/2020 9:53 AM   Dennis Wilkins 12-08-1940 [id number]  Referring provider: Margaretann Loveless, MD [address] [place],  [place] [address]  Chief Complaint  Patient presents with  . Elevated PSA    HPI: 80 year old male with a personal history of elevated PSA/BPH returns today for 44-month follow-up.  He was initially referred for elevated PSA 10.3.  We did previous baselines for comparison.  Subsequent values have been in the 7-8 range, most recently 8.0 as of yesterday.  He did have some obstructive urinary symptoms.  He was started on Flomax at last visit.  He reports today that his urinary symptoms have improved significantly.  He has had no side effects from this medicine.  IPSS as below.     IPSS    Row Name 04/12/20 1000         International Prostate Symptom Score   How often have you had the sensation of not emptying your bladder? Less than half the time     How often have you had to urinate less than every two hours? Less than 1 in 5 times     How often have you found you stopped and started again several times when you urinated? Less than 1 in 5 times     How often have you found it difficult to postpone urination? Not at All     How often have you had a weak urinary stream? Not at All     How often have you had to strain to start urination? Not at All     How many times did you typically get up at night to urinate? 2 Times     Total IPSS Score 6           Quality of Life due to urinary symptoms   If you were to spend the rest of your life with your urinary condition just the way it is now how would you feel about that? Pleased            Score:  1-7 Mild 8-19 Moderate 20-35 Severe  PMH: Past Medical History:  Diagnosis Date  . COVID-19 12/01/2019   diagnosed at [facility] on 12/01/2019  . Dyspnea   . Encephalopathy     Surgical History: Past Surgical History:  Procedure Laterality Date  . BACK SURGERY     lower lumbar    Home Medications:   Allergies as of 04/12/2020   No Known Allergies     Medication List       Accurate as of April 12, 2020 11:59 PM. If you have any questions, ask your nurse or doctor.        STOP taking these medications   acetaminophen 325 MG tablet Commonly known as: TYLENOL   guaiFENesin-dextromethorphan 100-10 MG/5ML syrup Commonly known as: ROBITUSSIN DM     TAKE these medications   amLODipine 5 MG tablet Commonly known as: NORVASC Take 5 mg by mouth daily.   ascorbic acid 500 MG tablet Commonly known as: VITAMIN C Take 1 tablet (500 mg total) by mouth daily.   ferrous sulfate 325 (65 FE) MG tablet Take 1 tablet (325 mg total) by mouth daily with breakfast.   tamsulosin 0.4 MG Caps capsule Commonly known as: Flomax Take 1 capsule (0.4 mg total) by mouth daily.   Vitamin D3 25 MCG tablet Commonly known as: Vitamin D Take 1 tablet (1,000 Units total) by mouth daily.   zinc sulfate 220 (50 Zn) MG capsule Take  1 capsule (220 mg total) by mouth daily.       Allergies: No Known Allergies  Family History: Family History  Problem Relation Age of Onset  . Prostate cancer Neg Hx   . Bladder Cancer Neg Hx   . Kidney cancer Neg Hx     Social History:  reports that he has quit smoking. He has never used smokeless tobacco. He reports previous alcohol use. He reports that he does not use drugs.   Physical Exam: BP (!) 163/77   Pulse 78   Ht 5\' 9"  (1.753 m)   Wt 220 lb (99.8 kg)   BMI 32.49 kg/m   Constitutional:  Alert and oriented, No acute distress. HEENT: [place] AT, moist mucus membranes.  Trachea midline, no masses. Cardiovascular: No clubbing, cyanosis, or edema. Respiratory: Normal respiratory effort, no increased work of breathing. Skin: No rashes, bruises or suspicious lesions. Neurologic: Grossly intact, no focal deficits, moving all 4 extremities. Psychiatric: Normal mood and affect.  Pertinent Imaging: Results for orders placed or performed in visit on 04/11/20   PSA  Result Value Ref Range   Prostate Specific Ag, Serum 8.0 (H) 0.0 - 4.0 ng/mL     Assessment & Plan:    1. BPH with urinary obstruction Marked improvement in urinary symptoms on Flomax  Not interested in procedural surgical intervention  He like to continue his medication, will recheck symptoms in a year or sooner as needed  2. Elevated PSA PSA is trended back down to his previous baseline, likely appropriate for his age and within his normal range  We will plan to just repeat this next year otherwise no indication for biopsy or further intervention at this time especially given his age and comorbidities.  Both he and his son are agreeable this plan.  Follow-up in 1 year with PSA/IPSS/PVR  06/11/20, MD  [facility] [address], [address] [place], [place] [phone]

## 2020-04-21 ENCOUNTER — Other Ambulatory Visit: Payer: Self-pay

## 2020-04-21 DIAGNOSIS — R972 Elevated prostate specific antigen [PSA]: Secondary | ICD-10-CM

## 2020-04-21 MED ORDER — TAMSULOSIN HCL 0.4 MG PO CAPS: 0.4000 mg | ORAL_CAPSULE | Freq: Every day | ORAL | 3 refills | Status: DC

## 2020-04-21 NOTE — Telephone Encounter (Signed)
Refill sent to [facility]. Pt aware.

## 2020-05-04 ENCOUNTER — Ambulatory Visit: Payer: Medicare Other | Admitting: Dermatology

## 2020-05-16 ENCOUNTER — Other Ambulatory Visit: Payer: Self-pay | Admitting: Urology

## 2020-05-16 DIAGNOSIS — R972 Elevated prostate specific antigen [PSA]: Secondary | ICD-10-CM

## 2020-08-03 ENCOUNTER — Other Ambulatory Visit: Payer: Self-pay | Admitting: Urology

## 2020-08-03 DIAGNOSIS — R972 Elevated prostate specific antigen [PSA]: Secondary | ICD-10-CM

## 2020-08-08 ENCOUNTER — Other Ambulatory Visit: Payer: Self-pay | Admitting: Urology

## 2020-08-08 DIAGNOSIS — R972 Elevated prostate specific antigen [PSA]: Secondary | ICD-10-CM

## 2020-10-30 ENCOUNTER — Other Ambulatory Visit: Payer: Self-pay | Admitting: Urology

## 2020-10-30 DIAGNOSIS — R972 Elevated prostate specific antigen [PSA]: Secondary | ICD-10-CM

## 2020-11-24 ENCOUNTER — Other Ambulatory Visit: Payer: Self-pay | Admitting: Urology

## 2020-11-24 DIAGNOSIS — R972 Elevated prostate specific antigen [PSA]: Secondary | ICD-10-CM

## 2021-04-08 ENCOUNTER — Other Ambulatory Visit: Payer: Self-pay | Admitting: Urology

## 2021-04-08 DIAGNOSIS — R972 Elevated prostate specific antigen [PSA]: Secondary | ICD-10-CM

## 2021-04-16 NOTE — Progress Notes (Incomplete)
? ?  04/16/21 ?8:43 AM  ? ?Dennis Wilkins ?[birth year] ?[id number] ? ?Referring provider:  ?Margaretann Loveless, MD ?[address] ?[place],  [place] [address] ?No chief complaint on file. ? ? ? ?HPI: ?Dennis Wilkins is a 81 y.o.male with a personal history of elevated PSA/BPH on Flomax, who presents today for 1 year follow-up with IPSS, PVR and PSA.  ? ?His previous baseline PSAs ranged from 7-8, his most recent PSA was 8.0 on 04/11/2020.  ? ? ? ?PMH: ?Past Medical History:  ?Diagnosis Date  ? COVID-19 12/01/2019  ? diagnosed at [facility] on 12/01/2019  ? Dyspnea   ? Encephalopathy   ? ? ?Surgical History: ?Past Surgical History:  ?Procedure Laterality Date  ? BACK SURGERY    ? lower lumbar  ? ? ?Home Medications:  ?Allergies as of 04/17/2021   ?No Known Allergies ?  ? ?  ?Medication List  ?  ? ?  ? Accurate as of April 16, 2021  8:43 AM. If you have any questions, ask your nurse or doctor.  ?  ?  ? ?  ? ?amLODipine 5 MG tablet ?Commonly known as: NORVASC ?Take 5 mg by mouth daily. ?  ?ascorbic acid 500 MG tablet ?Commonly known as: VITAMIN C ?Take 1 tablet (500 mg total) by mouth daily. ?  ?ferrous sulfate 325 (65 FE) MG tablet ?Take 1 tablet (325 mg total) by mouth daily with breakfast. ?  ?tamsulosin 0.4 MG Caps capsule ?Commonly known as: FLOMAX ?TAKE ONE CAPSULE BY MOUTH ONE TIME DAILY ?  ?Vitamin D3 25 MCG tablet ?Commonly known as: Vitamin D ?Take 1 tablet (1,000 Units total) by mouth daily. ?  ?zinc sulfate 220 (50 Zn) MG capsule ?Take 1 capsule (220 mg total) by mouth daily. ?  ? ?  ? ? ?Allergies: No Known Allergies ? ?Family History: ?Family History  ?Problem Relation Age of Onset  ? Prostate cancer Neg Hx   ? Bladder Cancer Neg Hx   ? Kidney cancer Neg Hx   ? ? ?Social History:  reports that he has quit smoking. He has never used smokeless tobacco. He reports that he does not currently use alcohol. He reports that he does not use drugs. ? ? ?Physical Exam: ?There were no vitals taken for this visit.  ?Constitutional:  Alert and  oriented, No acute distress. ?HEENT: [place] AT, moist mucus membranes.  Trachea midline, no masses. ?Cardiovascular: No clubbing, cyanosis, or edema. ?Respiratory: Normal respiratory effort, no increased work of breathing. ?Skin: No rashes, bruises or suspicious lesions. ?Neurologic: Grossly intact, no focal deficits, moving all 4 extremities. ?Psychiatric: Normal mood and affect. ? ?Laboratory Data: ?Lab Results  ?Component Value Date  ? CREATININE 1.17 12/04/2019  ? ?Urinalysis ? ? ?Pertinent Imaging: ? ? ?Assessment & Plan:   ? ? ?No follow-ups on file. ? ?I,Kailey Littlejohn,acting as a scribe for Vanna Scotland, MD.,have documented all relevant documentation on the behalf of Vanna Scotland, MD,as directed by  Vanna Scotland, MD while in the presence of Vanna Scotland, MD. ? ? ?[facility] ?[address], [address] ?[place], [place] [address] ?[phone] ? ?

## 2021-04-17 ENCOUNTER — Ambulatory Visit: Payer: Medicare Other | Admitting: Urology

## 2021-07-07 ENCOUNTER — Other Ambulatory Visit: Payer: Self-pay | Admitting: Urology

## 2021-07-07 DIAGNOSIS — R972 Elevated prostate specific antigen [PSA]: Secondary | ICD-10-CM

## 2022-03-25 ENCOUNTER — Encounter: Payer: Self-pay | Admitting: Internal Medicine

## 2022-03-25 ENCOUNTER — Ambulatory Visit (INDEPENDENT_AMBULATORY_CARE_PROVIDER_SITE_OTHER): Payer: Medicare Other | Admitting: Internal Medicine

## 2022-03-25 VITALS — BP 120/68 | HR 91 | Ht 69.0 in | Wt 211.2 lb

## 2022-03-25 DIAGNOSIS — N400 Enlarged prostate without lower urinary tract symptoms: Secondary | ICD-10-CM | POA: Diagnosis not present

## 2022-03-25 DIAGNOSIS — I10 Essential (primary) hypertension: Secondary | ICD-10-CM

## 2022-03-25 DIAGNOSIS — D508 Other iron deficiency anemias: Secondary | ICD-10-CM

## 2022-03-25 DIAGNOSIS — E782 Mixed hyperlipidemia: Secondary | ICD-10-CM | POA: Diagnosis not present

## 2022-03-25 DIAGNOSIS — D649 Anemia, unspecified: Secondary | ICD-10-CM | POA: Insufficient documentation

## 2022-03-25 NOTE — Progress Notes (Signed)
Established Patient Office Visit  Subjective:  Patient ID: Dennis Wilkins, male    DOB: Aug 17, 1940  Age: 82 y.o. MRN: [id number]  Chief Complaint  Patient presents with   Follow-up    3 month follow up    Patient comes in for his follow-up today.  He is feeling quite well and has no new complaints.  Previously he had complained about some pain and stiffness in his hands which have now resolved completely.  He is fasting for some blood work.  He is taking all his medications regularly.     Past Medical History:  Diagnosis Date   COVID-19 12/01/2019   diagnosed at [facility] on 12/01/2019   Dyspnea    Encephalopathy     Social History   Socioeconomic History   Marital status: Single    Spouse name: Not on file   Number of children: Not on file   Years of education: Not on file   Highest education level: Not on file  Occupational History   Not on file  Tobacco Use   Smoking status: Former   Smokeless tobacco: Never  Vaping Use   Vaping Use: Never used  Substance and Sexual Activity   Alcohol use: Not Currently   Drug use: Never   Sexual activity: Not on file  Other Topics Concern   Not on file  Social History Narrative   Not on file   Social Determinants of Health   Financial Resource Strain: Not on file  Food Insecurity: Not on file  Transportation Needs: Not on file  Physical Activity: Not on file  Stress: Not on file  Social Connections: Not on file  Intimate Partner Violence: Not on file    Family History  Problem Relation Age of Onset   Prostate cancer Neg Hx    Bladder Cancer Neg Hx    Kidney cancer Neg Hx     No Known Allergies  Review of Systems  Constitutional:  Negative for chills, diaphoresis, fever, malaise/fatigue and weight loss.  HENT:  Negative for congestion, ear discharge, ear pain, hearing loss, nosebleeds and tinnitus.   Eyes:  Negative for blurred vision, double vision, photophobia, pain, discharge and redness.  Respiratory:  Positive  for shortness of breath. Negative for cough and hemoptysis.   Cardiovascular:  Negative for chest pain, orthopnea, claudication and leg swelling.  Gastrointestinal:  Negative for abdominal pain, blood in stool, diarrhea, heartburn, nausea and vomiting.  Genitourinary:  Negative for dysuria, frequency, hematuria and urgency.  Musculoskeletal:  Negative for back pain, joint pain, myalgias and neck pain.  Skin:  Negative for itching and rash.  Neurological:  Negative for dizziness, tingling, tremors, sensory change, speech change, focal weakness, seizures, weakness and headaches.  Endo/Heme/Allergies:  Negative for environmental allergies. Does not bruise/bleed easily.  Psychiatric/Behavioral: Negative.  Negative for depression.        Objective:   BP 120/68   Pulse 91   Ht 5' 9"$  (1.753 m)   Wt 211 lb 3.2 oz (95.8 kg)   SpO2 98%   BMI 31.19 kg/m   Vitals:   03/25/22 1015  BP: 120/68  Pulse: 91  Height: 5' 9"$  (1.753 m)  Weight: 211 lb 3.2 oz (95.8 kg)  SpO2: 98%  BMI (Calculated): 31.17    Physical Exam Vitals and nursing note reviewed.  HENT:     Head: Normocephalic.     Nose: Nose normal.  Eyes:     Pupils: Pupils are equal, round, and reactive to  light.  Cardiovascular:     Rate and Rhythm: Normal rate and regular rhythm.     Pulses: Normal pulses.  Pulmonary:     Effort: Pulmonary effort is normal.     Breath sounds: Normal breath sounds.  Abdominal:     General: Abdomen is flat. Bowel sounds are normal.     Palpations: Abdomen is soft.  Musculoskeletal:        General: Normal range of motion.     Cervical back: Normal range of motion.  Skin:    General: Skin is warm and dry.  Neurological:     General: No focal deficit present.     Mental Status: He is alert and oriented to person, place, and time.  Psychiatric:        Mood and Affect: Mood normal.        Behavior: Behavior normal.      No results found for any visits on 03/25/22.  No results found  for this or any previous visit (from the past 2160 hour(s)).    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   Total time spent: 20 minutes  Perrin Maltese, MD  03/25/2022

## 2022-03-26 LAB — CBC WITH DIFFERENTIAL
Basophils Absolute: 0.1 10*3/uL (ref 0.0–0.2)
Basos: 1 %
EOS (ABSOLUTE): 0.3 10*3/uL (ref 0.0–0.4)
Eos: 6 %
Hematocrit: 42 % (ref 37.5–51.0)
Hemoglobin: 14.3 g/dL (ref 13.0–17.7)
Immature Grans (Abs): 0 10*3/uL (ref 0.0–0.1)
Immature Granulocytes: 0 %
Lymphocytes Absolute: 1.4 10*3/uL (ref 0.7–3.1)
Lymphs: 28 %
MCH: 33.4 pg — ABNORMAL HIGH (ref 26.6–33.0)
MCHC: 34 g/dL (ref 31.5–35.7)
MCV: 98 fL — ABNORMAL HIGH (ref 79–97)
Monocytes Absolute: 0.5 10*3/uL (ref 0.1–0.9)
Monocytes: 9 %
Neutrophils Absolute: 2.9 10*3/uL (ref 1.4–7.0)
Neutrophils: 56 %
RBC: 4.28 x10E6/uL (ref 4.14–5.80)
RDW: 13.8 % (ref 11.6–15.4)
WBC: 5.1 10*3/uL (ref 3.4–10.8)

## 2022-03-26 LAB — LIPID PANEL W/O CHOL/HDL RATIO
Cholesterol, Total: 182 mg/dL (ref 100–199)
HDL: 34 mg/dL — ABNORMAL LOW (ref >39)
LDL Chol Calc (NIH): 130 mg/dL — ABNORMAL HIGH (ref 0–99)
Triglycerides: 98 mg/dL (ref 0–149)
VLDL Cholesterol Cal: 18 mg/dL (ref 5–40)

## 2022-04-09 ENCOUNTER — Other Ambulatory Visit: Payer: Self-pay | Admitting: Internal Medicine

## 2022-04-09 DIAGNOSIS — R972 Elevated prostate specific antigen [PSA]: Secondary | ICD-10-CM

## 2022-07-25 ENCOUNTER — Ambulatory Visit (INDEPENDENT_AMBULATORY_CARE_PROVIDER_SITE_OTHER): Payer: Medicare Other | Admitting: Internal Medicine

## 2022-07-25 ENCOUNTER — Encounter: Payer: Self-pay | Admitting: Internal Medicine

## 2022-07-25 VITALS — BP 174/86 | HR 68 | Ht 69.0 in | Wt 213.0 lb

## 2022-07-25 DIAGNOSIS — N400 Enlarged prostate without lower urinary tract symptoms: Secondary | ICD-10-CM | POA: Diagnosis not present

## 2022-07-25 DIAGNOSIS — D508 Other iron deficiency anemias: Secondary | ICD-10-CM

## 2022-07-25 DIAGNOSIS — E782 Mixed hyperlipidemia: Secondary | ICD-10-CM | POA: Diagnosis not present

## 2022-07-25 DIAGNOSIS — I10 Essential (primary) hypertension: Secondary | ICD-10-CM

## 2022-07-25 NOTE — Progress Notes (Signed)
Established Patient Office Visit  Subjective:  Patient ID: Dennis Wilkins, male    DOB: [birth year]  Age: 82 y.o. MRN: [id number]  Chief Complaint  Patient presents with   Follow-up    4 month follow up    Patient comes in for his follow-up today.  He states he is feeling quite well but his blood pressure is very high.  He is fasting today for blood work so he decided not to take his blood pressure medications. He has no complaints whatsoever.  But will ask him to return in few days so we can recheck his blood pressure and to discuss his lab results.    No other concerns at this time.   Past Medical History:  Diagnosis Date   COVID-19 12/01/2019   diagnosed at [facility] on 12/01/2019   Dyspnea    Encephalopathy     Past Surgical History:  Procedure Laterality Date   BACK SURGERY     lower lumbar    Social History   Socioeconomic History   Marital status: Single    Spouse name: Not on file   Number of children: Not on file   Years of education: Not on file   Highest education level: Not on file  Occupational History   Not on file  Tobacco Use   Smoking status: Former   Smokeless tobacco: Never  Vaping Use   Vaping Use: Never used  Substance and Sexual Activity   Alcohol use: Not Currently   Drug use: Never   Sexual activity: Not on file  Other Topics Concern   Not on file  Social History Narrative   Not on file   Social Determinants of Health   Financial Resource Strain: Not on file  Food Insecurity: Not on file  Transportation Needs: Not on file  Physical Activity: Not on file  Stress: Not on file  Social Connections: Not on file  Intimate Partner Violence: Not on file    Family History  Problem Relation Age of Onset   Prostate cancer Neg Hx    Bladder Cancer Neg Hx    Kidney cancer Neg Hx     No Known Allergies  Review of Systems  Constitutional:  Negative for chills, diaphoresis, fever, malaise/fatigue and weight loss.  HENT:  Negative for  congestion, ear discharge, ear pain, hearing loss, nosebleeds, sinus pain, sore throat and tinnitus.   Eyes:  Negative for blurred vision and double vision.  Respiratory:  Negative for cough, shortness of breath, wheezing and stridor.   Cardiovascular:  Negative for chest pain, palpitations, claudication, leg swelling and PND.  Gastrointestinal:  Negative for abdominal pain, blood in stool, constipation, heartburn, melena and nausea.  Genitourinary:  Negative for dysuria, flank pain, frequency and urgency.  Musculoskeletal:  Negative for falls, joint pain, myalgias and neck pain.  Neurological:  Negative for dizziness, sensory change, speech change, focal weakness, seizures, loss of consciousness, weakness and headaches.  Psychiatric/Behavioral:  Negative for depression and memory loss. The patient is not nervous/anxious and does not have insomnia.        Objective:   BP (!) 174/86   Pulse 68   Ht 5\' 9"  (1.753 m)   Wt 213 lb (96.6 kg)   SpO2 97%   BMI 31.45 kg/m   Vitals:   07/25/22 1117  BP: (!) 174/86  Pulse: 68  Height: 5\' 9"  (1.753 m)  Weight: 213 lb (96.6 kg)  SpO2: 97%  BMI (Calculated): 31.44    Physical  Exam Vitals and nursing note reviewed.  Constitutional:      Appearance: Normal appearance.  HENT:     Head: Normocephalic and atraumatic.     Nose: Nose normal.     Mouth/Throat:     Mouth: Mucous membranes are dry.  Cardiovascular:     Rate and Rhythm: Normal rate and regular rhythm.     Pulses: Normal pulses.     Heart sounds: Normal heart sounds.     No gallop.  Pulmonary:     Effort: Pulmonary effort is normal.     Breath sounds: Normal breath sounds. No wheezing, rhonchi or rales.  Chest:     Chest wall: No tenderness.  Abdominal:     General: Bowel sounds are normal.     Palpations: Abdomen is soft. There is no mass.     Tenderness: There is no right CVA tenderness, left CVA tenderness or guarding.  Musculoskeletal:        General: Normal range  of motion.     Cervical back: Normal range of motion.     Left lower leg: No edema.  Neurological:     General: No focal deficit present.     Mental Status: He is alert and oriented to person, place, and time.  Psychiatric:        Mood and Affect: Mood normal.        Behavior: Behavior normal.      No results found for any visits on 07/25/22.  No results found for this or any previous visit (from the past 2160 hour(s)).    Assessment & Plan:  Check blood work today.  Monitor blood pressure.  Patient will come back for a BP check and to discuss results. Problem List Items Addressed This Visit     Absolute anemia   Relevant Orders   CBC With Differential   Benign prostatic hyperplasia without lower urinary tract symptoms   Relevant Orders   PSA   Essential hypertension, benign - Primary   Relevant Orders   CMP14+EGFR   Mixed hyperlipidemia   Relevant Orders   Lipid Panel w/o Chol/HDL Ratio    Return in about 1 week (around 08/01/2022).   Total time spent: 30 minutes  Margaretann Loveless, MD  07/25/2022   This document may have been prepared by [facility] Voice Recognition software and as such may include unintentional dictation errors.

## 2022-07-26 LAB — CMP14+EGFR
ALT: 15 IU/L (ref 0–44)
AST: 20 IU/L (ref 0–40)
Albumin/Globulin Ratio: 1.7
Albumin: 4.4 g/dL (ref 3.7–4.7)
Alkaline Phosphatase: 63 IU/L (ref 44–121)
BUN/Creatinine Ratio: 14 (ref 10–24)
BUN: 15 mg/dL (ref 8–27)
Bilirubin Total: 0.6 mg/dL (ref 0.0–1.2)
CO2: 23 mmol/L (ref 20–29)
Calcium: 9.3 mg/dL (ref 8.6–10.2)
Chloride: 104 mmol/L (ref 96–106)
Creatinine, Ser: 1.05 mg/dL (ref 0.76–1.27)
Globulin, Total: 2.6 g/dL (ref 1.5–4.5)
Glucose: 97 mg/dL (ref 70–99)
Potassium: 5.3 mmol/L — ABNORMAL HIGH (ref 3.5–5.2)
Sodium: 141 mmol/L (ref 134–144)
Total Protein: 7 g/dL (ref 6.0–8.5)
eGFR: 70 mL/min/{1.73_m2} (ref >59)

## 2022-07-26 LAB — CBC WITH DIFFERENTIAL
Basophils Absolute: 0 10*3/uL (ref 0.0–0.2)
Basos: 1 %
EOS (ABSOLUTE): 0.2 10*3/uL (ref 0.0–0.4)
Eos: 4 %
Hematocrit: 38.7 % (ref 37.5–51.0)
Hemoglobin: 13.6 g/dL (ref 13.0–17.7)
Immature Grans (Abs): 0 10*3/uL (ref 0.0–0.1)
Immature Granulocytes: 0 %
Lymphocytes Absolute: 1.7 10*3/uL (ref 0.7–3.1)
Lymphs: 38 %
MCH: 33.5 pg — ABNORMAL HIGH (ref 26.6–33.0)
MCHC: 35.1 g/dL (ref 31.5–35.7)
MCV: 95 fL (ref 79–97)
Monocytes Absolute: 0.5 10*3/uL (ref 0.1–0.9)
Monocytes: 11 %
Neutrophils Absolute: 2.1 10*3/uL (ref 1.4–7.0)
Neutrophils: 46 %
RBC: 4.06 x10E6/uL — ABNORMAL LOW (ref 4.14–5.80)
RDW: 14.5 % (ref 11.6–15.4)
WBC: 4.5 10*3/uL (ref 3.4–10.8)

## 2022-07-26 LAB — LIPID PANEL W/O CHOL/HDL RATIO
Cholesterol, Total: 179 mg/dL (ref 100–199)
HDL: 36 mg/dL — ABNORMAL LOW (ref >39)
LDL Chol Calc (NIH): 124 mg/dL — ABNORMAL HIGH (ref 0–99)
Triglycerides: 103 mg/dL (ref 0–149)
VLDL Cholesterol Cal: 19 mg/dL (ref 5–40)

## 2022-07-26 LAB — PSA: Prostate Specific Ag, Serum: 7.6 ng/mL — ABNORMAL HIGH (ref 0.0–4.0)

## 2022-08-01 ENCOUNTER — Encounter: Payer: Self-pay | Admitting: Internal Medicine

## 2022-08-01 ENCOUNTER — Ambulatory Visit (INDEPENDENT_AMBULATORY_CARE_PROVIDER_SITE_OTHER): Payer: Medicare Other | Admitting: Internal Medicine

## 2022-08-01 DIAGNOSIS — I10 Essential (primary) hypertension: Secondary | ICD-10-CM

## 2022-08-01 DIAGNOSIS — E875 Hyperkalemia: Secondary | ICD-10-CM | POA: Diagnosis not present

## 2022-08-01 DIAGNOSIS — D508 Other iron deficiency anemias: Secondary | ICD-10-CM | POA: Diagnosis not present

## 2022-08-01 DIAGNOSIS — E782 Mixed hyperlipidemia: Secondary | ICD-10-CM

## 2022-08-01 MED ORDER — BENAZEPRIL HCL 10 MG PO TABS: 10.0000 mg | ORAL_TABLET | Freq: Every day | ORAL | 0 refills | Status: DC

## 2022-08-01 NOTE — Progress Notes (Signed)
Established Patient Office Visit  Subjective:  Patient ID: Dennis Wilkins, male    DOB: 1940-06-30  Age: 82 y.o. MRN: [id number]  Chief Complaint  Patient presents with   Follow-up    1 week F/u    Patient in office for week follow up on his blood pressure.  Today he has taken his blood pressure medicine , and his blood pressure is better than before,but  readings from home are still high, running in 140s to 166 systolic. Patient has no complaints, he denies any headaches or dizziness, no chest pain and no shortness of breath.  He is taking Lotrel at 5/10 mg/day.  He has just picked up a fresh refill.  Will add a separate benazepril at 10 mg to make a total of 5/20 mg/day.  Will switch to single capsule once he completes his prescription.  Patient understands.  Will continue to check blood pressure at home and bring record. Needs repeat potassium level.    No other concerns at this time.   Past Medical History:  Diagnosis Date   COVID-19 12/01/2019   diagnosed at [facility] on 12/01/2019   Dyspnea    Encephalopathy     Past Surgical History:  Procedure Laterality Date   BACK SURGERY     lower lumbar    Social History   Socioeconomic History   Marital status: Single    Spouse name: Not on file   Number of children: Not on file   Years of education: Not on file   Highest education level: Not on file  Occupational History   Not on file  Tobacco Use   Smoking status: Former   Smokeless tobacco: Never  Vaping Use   Vaping Use: Never used  Substance and Sexual Activity   Alcohol use: Not Currently   Drug use: Never   Sexual activity: Not on file  Other Topics Concern   Not on file  Social History Narrative   Not on file   Social Determinants of Health   Financial Resource Strain: Not on file  Food Insecurity: Not on file  Transportation Needs: Not on file  Physical Activity: Not on file  Stress: Not on file  Social Connections: Not on file  Intimate Partner  Violence: Not on file    Family History  Problem Relation Age of Onset   Prostate cancer Neg Hx    Bladder Cancer Neg Hx    Kidney cancer Neg Hx     No Known Allergies  Review of Systems  Constitutional: Negative.  Negative for diaphoresis, malaise/fatigue and weight loss.  HENT:  Positive for hearing loss. Negative for congestion and tinnitus.   Eyes: Negative.   Respiratory:  Positive for wheezing. Negative for cough and shortness of breath.   Cardiovascular: Negative.  Negative for chest pain, palpitations and leg swelling.  Gastrointestinal: Negative.  Negative for abdominal pain, constipation, diarrhea, heartburn, nausea and vomiting.  Genitourinary: Negative.  Negative for dysuria.  Musculoskeletal: Negative.  Negative for joint pain and myalgias.  Skin: Negative.   Neurological: Negative.  Negative for dizziness, speech change and headaches.  Endo/Heme/Allergies: Negative.   Psychiatric/Behavioral:  Positive for depression.        Objective:   BP 130/79   Pulse 74   Ht 5\' 9"  (1.753 m)   Wt 211 lb 3.2 oz (95.8 kg)   SpO2 98%   BMI 31.19 kg/m   Vitals:   08/01/22 1107  BP: 130/79  Pulse: 74  Height: 5'  9" (1.753 m)  Weight: 211 lb 3.2 oz (95.8 kg)  SpO2: 98%  BMI (Calculated): 31.17    Physical Exam Vitals and nursing note reviewed.  Constitutional:      Appearance: Normal appearance.  HENT:     Head: Normocephalic and atraumatic.     Nose: Nose normal.  Cardiovascular:     Rate and Rhythm: Normal rate and regular rhythm.     Pulses: Normal pulses.     Heart sounds: Normal heart sounds. No murmur heard. Pulmonary:     Effort: Pulmonary effort is normal.     Breath sounds: No wheezing or rales.  Chest:     Chest wall: No tenderness.  Abdominal:     General: Bowel sounds are normal. There is no distension.     Palpations: Abdomen is soft. There is no mass.     Tenderness: There is no abdominal tenderness. There is no right CVA tenderness, left  CVA tenderness or guarding.  Musculoskeletal:        General: Normal range of motion.     Cervical back: Normal range of motion.  Skin:    General: Skin is warm and dry.  Neurological:     General: No focal deficit present.     Mental Status: He is alert and oriented to person, place, and time.  Psychiatric:        Mood and Affect: Mood normal.        Behavior: Behavior normal.      No results found for any visits on 08/01/22.  Recent Results (from the past 2160 hour(s))  CMP14+EGFR     Status: Abnormal   Collection Time: 07/25/22 11:48 AM  Result Value Ref Range   Glucose 97 70 - 99 mg/dL   BUN 15 8 - 27 mg/dL   Creatinine, Ser 4.09 0.76 - 1.27 mg/dL   eGFR 70 >81 XB/JYN/8.29   BUN/Creatinine Ratio 14 10 - 24   Sodium 141 134 - 144 mmol/L   Potassium 5.3 (H) 3.5 - 5.2 mmol/L   Chloride 104 96 - 106 mmol/L   CO2 23 20 - 29 mmol/L   Calcium 9.3 8.6 - 10.2 mg/dL   Total Protein 7.0 6.0 - 8.5 g/dL   Albumin 4.4 3.7 - 4.7 g/dL   Globulin, Total 2.6 1.5 - 4.5 g/dL   Albumin/Globulin Ratio 1.7    Bilirubin Total 0.6 0.0 - 1.2 mg/dL   Alkaline Phosphatase 63 44 - 121 IU/L   AST 20 0 - 40 IU/L   ALT 15 0 - 44 IU/L  Lipid Panel w/o Chol/HDL Ratio     Status: Abnormal   Collection Time: 07/25/22 11:48 AM  Result Value Ref Range   Cholesterol, Total 179 100 - 199 mg/dL   Triglycerides 562 0 - 149 mg/dL   HDL 36 (L) >13 mg/dL   VLDL Cholesterol Cal 19 5 - 40 mg/dL   LDL Chol Calc (NIH) 086 (H) 0 - 99 mg/dL  CBC With Differential     Status: Abnormal   Collection Time: 07/25/22 11:48 AM  Result Value Ref Range   WBC 4.5 3.4 - 10.8 x10E3/uL   RBC 4.06 (L) 4.14 - 5.80 x10E6/uL   Hemoglobin 13.6 13.0 - 17.7 g/dL   Hematocrit 57.8 46.9 - 51.0 %   MCV 95 79 - 97 fL   MCH 33.5 (H) 26.6 - 33.0 pg   MCHC 35.1 31.5 - 35.7 g/dL   RDW 62.9 52.8 - 41.3 %  Neutrophils 46 Not Estab. %   Lymphs 38 Not Estab. %   Monocytes 11 Not Estab. %   Eos 4 Not Estab. %   Basos 1 Not Estab. %    Neutrophils Absolute 2.1 1.4 - 7.0 x10E3/uL   Lymphocytes Absolute 1.7 0.7 - 3.1 x10E3/uL   Monocytes Absolute 0.5 0.1 - 0.9 x10E3/uL   EOS (ABSOLUTE) 0.2 0.0 - 0.4 x10E3/uL   Basophils Absolute 0.0 0.0 - 0.2 x10E3/uL   Immature Granulocytes 0 Not Estab. %   Immature Grans (Abs) 0.0 0.0 - 0.1 x10E3/uL    Comment: **Effective September 09, 2022, profile [id number] CBC/Differential**   (No Platelet) will be made non-orderable. [facility] Offers:   [id number] CBC With Differential/Platelet   PSA     Status: Abnormal   Collection Time: 07/25/22 11:48 AM  Result Value Ref Range   Prostate Specific Ag, Serum 7.6 (H) 0.0 - 4.0 ng/mL    Comment: Roche ECLIA methodology. According to the American Urological Association, Serum PSA should decrease and remain at undetectable levels after radical prostatectomy. The AUA defines biochemical recurrence as an initial PSA value 0.2 ng/mL or greater followed by a subsequent confirmatory PSA value 0.2 ng/mL or greater. Values obtained with different assay methods or kits cannot be used interchangeably. Results cannot be interpreted as absolute evidence of the presence or absence of malignant disease.       Assessment & Plan:  Patient feeling well. Blood pressure continues to be elevated. Will had  an additional 10 mg of benazepril. Potassium was elevated on previous labs, will recheck today.   Problem List Items Addressed This Visit     Absolute anemia   Essential hypertension, benign   Relevant Medications   benazepril (LOTENSIN) 10 MG tablet   Mixed hyperlipidemia   Relevant Medications   benazepril (LOTENSIN) 10 MG tablet   Serum potassium elevated   Relevant Orders   Potassium    Return in about 2 weeks (around 08/15/2022) for for b/p check.   Total time spent: 30 minutes  Margaretann Loveless, MD  08/01/2022   This document may have been prepared by [facility] Voice Recognition software and as such may include unintentional dictation errors.

## 2022-08-02 LAB — POTASSIUM: Potassium: 4.5 mmol/L (ref 3.5–5.2)

## 2022-08-12 NOTE — Progress Notes (Signed)
Patient notified

## 2022-08-19 ENCOUNTER — Ambulatory Visit: Payer: 59 | Admitting: Internal Medicine

## 2022-08-26 ENCOUNTER — Ambulatory Visit: Payer: 59 | Admitting: Internal Medicine

## 2022-08-30 ENCOUNTER — Other Ambulatory Visit: Payer: Self-pay | Admitting: Internal Medicine

## 2022-08-30 MED ORDER — AMLODIPINE BESY-BENAZEPRIL HCL 5-10 MG PO CAPS: 1.0000 | ORAL_CAPSULE | Freq: Every day | ORAL | 1 refills | Status: DC

## 2022-09-15 IMAGING — CT CT HEAD W/O CM
3 series · 15 of 47 positions shown, 18 images · non-contrast
Comparison: None.

CLINICAL DATA: Cough and fever with mental status changes

EXAM:
CT HEAD WITHOUT CONTRAST
TECHNIQUE: Contiguous axial images were obtained from the base of the skull
through the vertex without intravenous contrast.

[Series 3: head wo · axial · 0.46mm/px · z∈[-101,+24]mm · 9 of 31 slices shown, 12 images]
[im 3/31  brain]
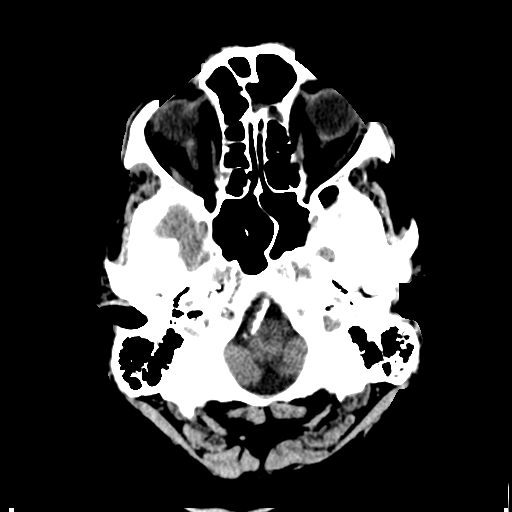
[im 3/31  bone]
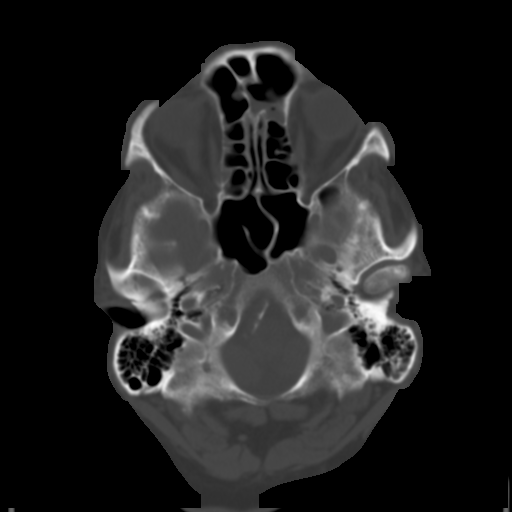
[im 6/31  brain]
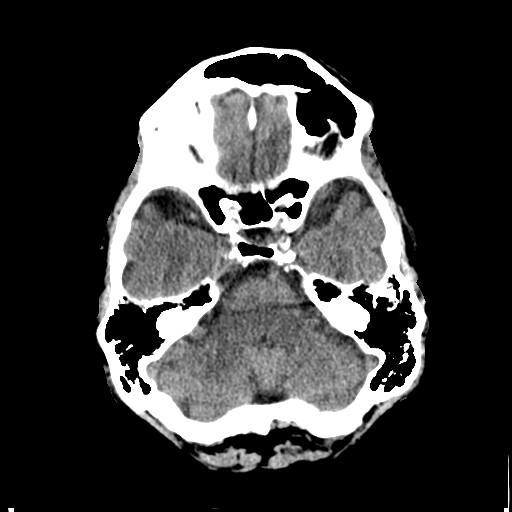
[im 9/31  brain]
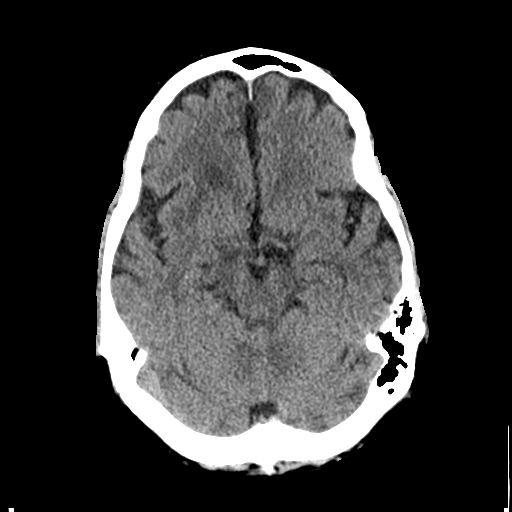
[im 12/31  brain]
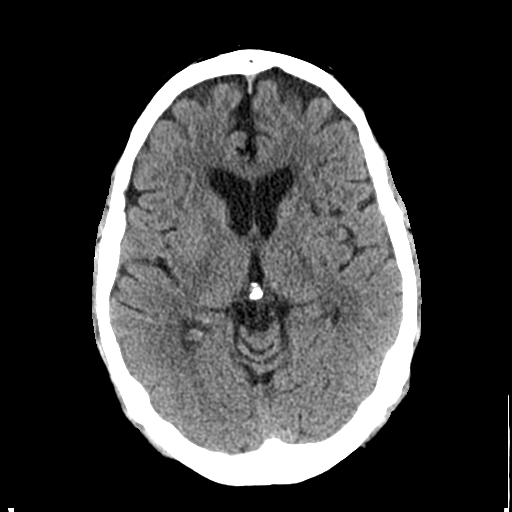
[im 16/31  brain]
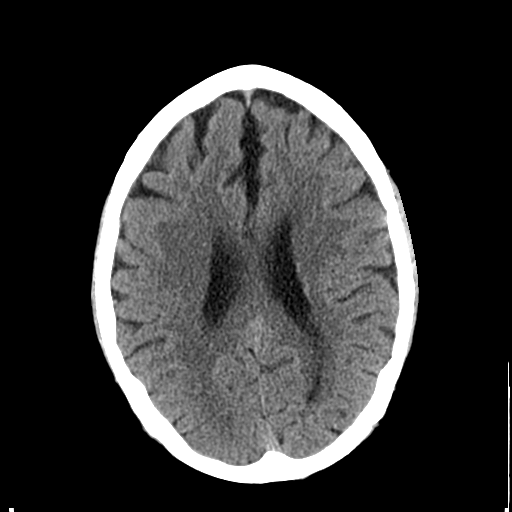
[im 16/31  bone]
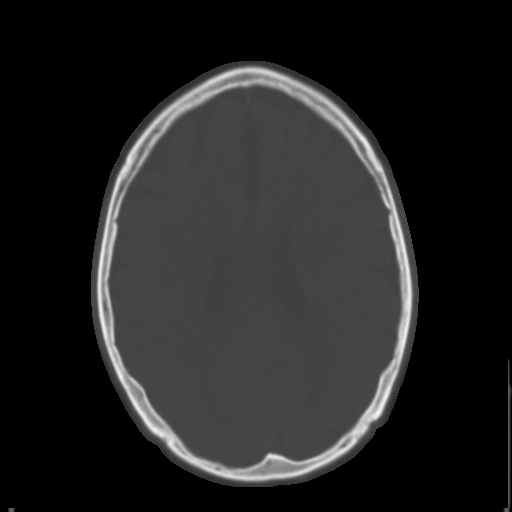
[im 19/31  brain]
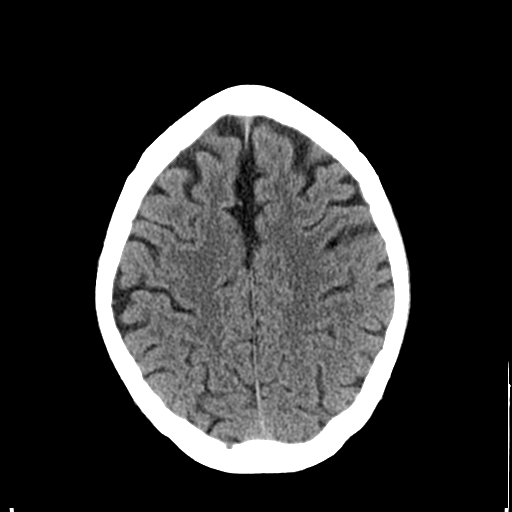
[im 22/31  brain]
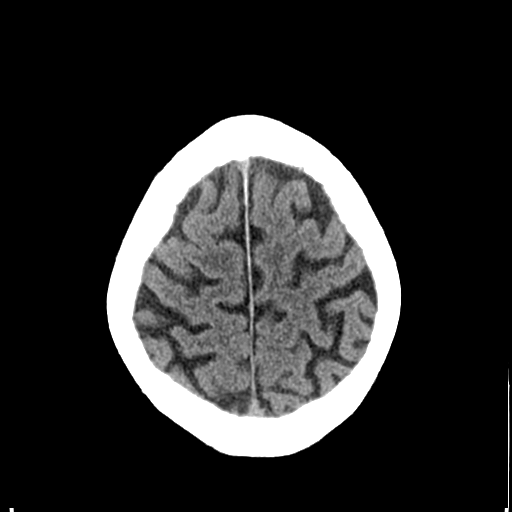
[im 25/31  brain]
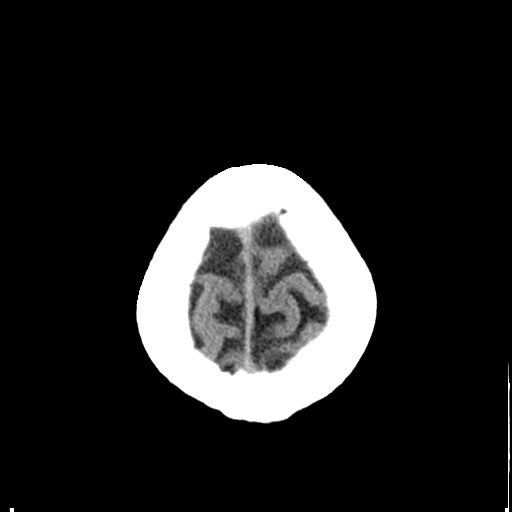
[im 28/31  brain]
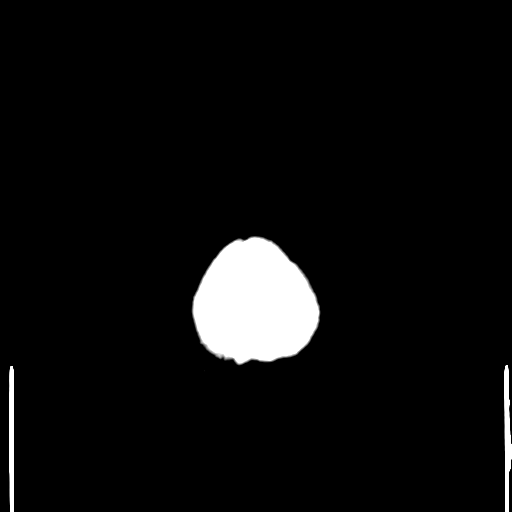
[im 28/31  bone]
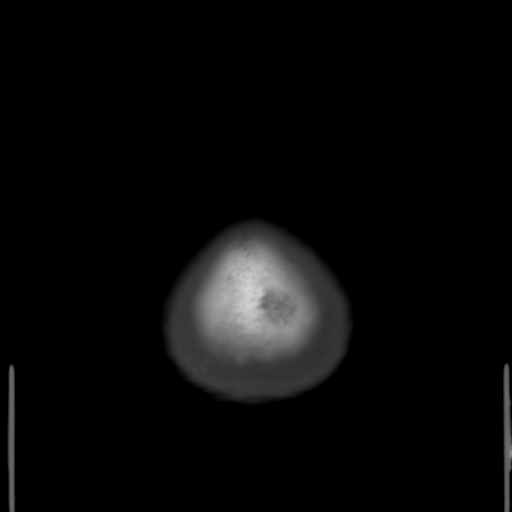

[Series 4: coronal soft tissue · coronal · 0.32mm/px · 3 of 70 slices shown]
[im 24/70  brain]
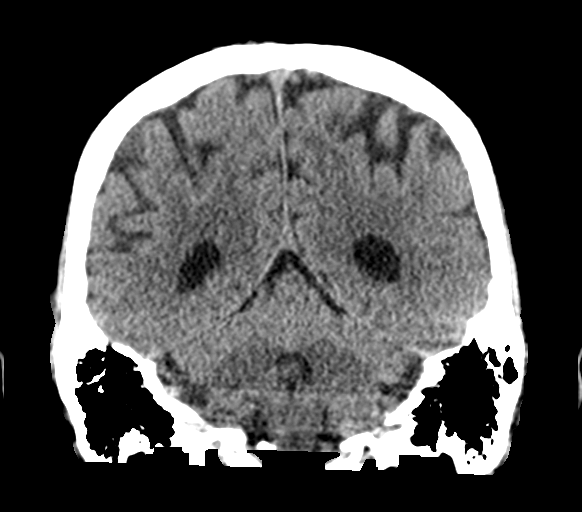
[im 31/70  brain]
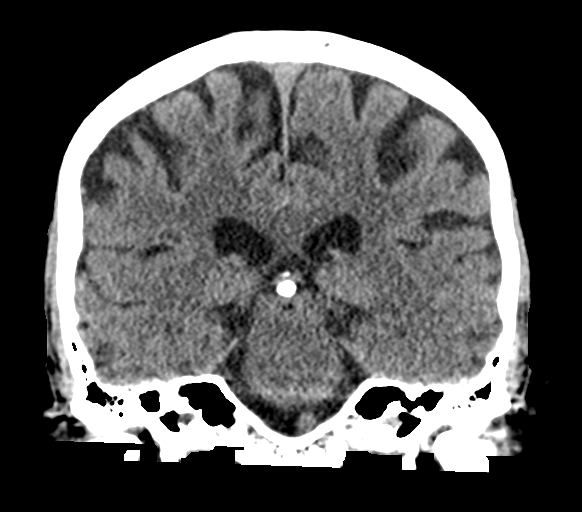
[im 39/70  brain]
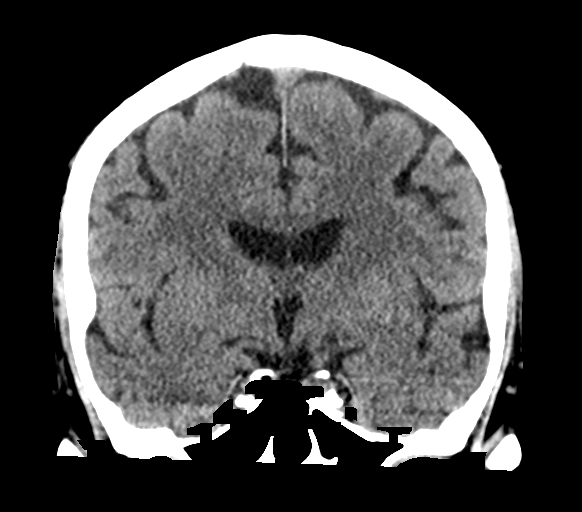

[Series 5: sagittal soft tissue · sagittal · 0.32mm/px · 3 of 57 slices shown]
[im 19/57  brain]
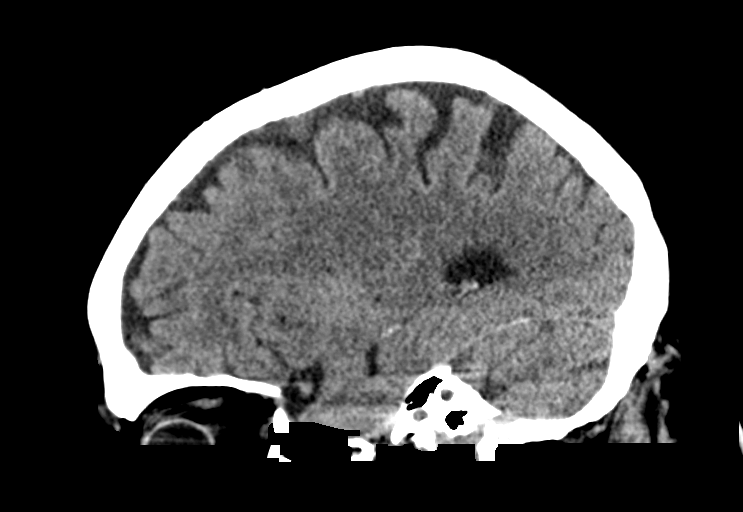
[im 29/57  brain]
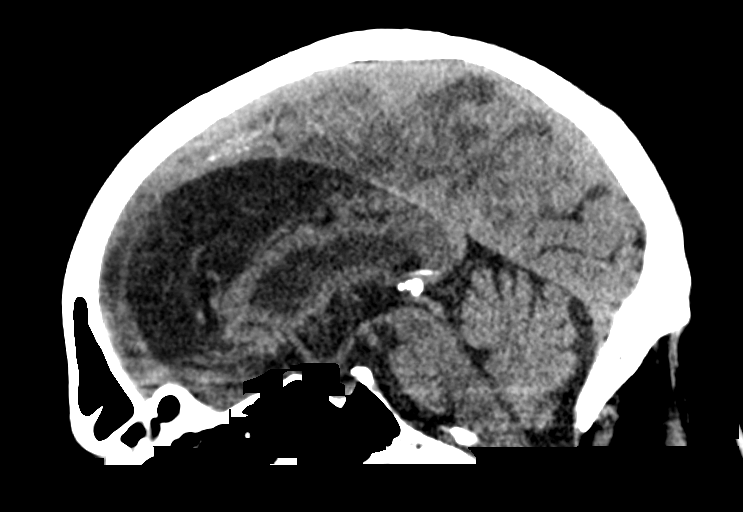
[im 38/57  brain]
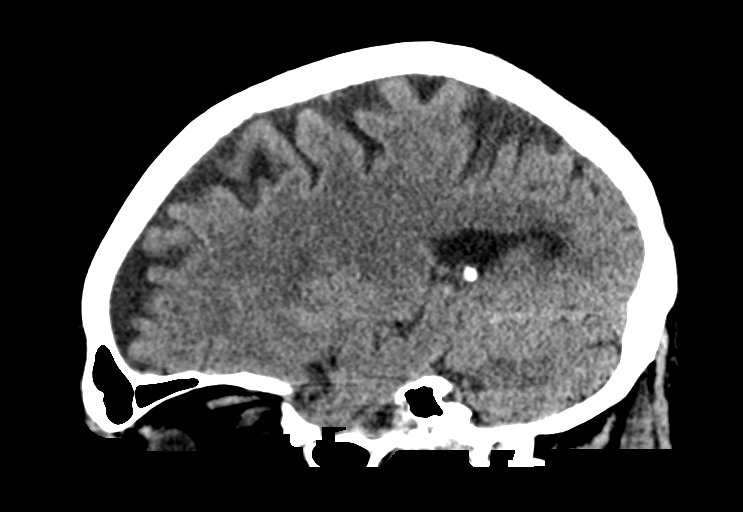

[15 of 47 positions shown; findings below may reference images not displayed]

FINDINGS: Brain: No evidence of acute infarction, hemorrhage, hydrocephalus,
extra-axial collection or mass lesion/mass effect. Chronic atrophic
and ischemic changes are noted.

Vascular: No hyperdense vessel or unexpected calcification.

Skull: Normal. Negative for fracture or focal lesion.

Sinuses/Orbits: Orbits and their contents are within normal limits.
Air-fluid levels are noted within the sphenoid sinus with mucosal
changes in the ethmoid sinuses consistent with sinusitis.

Other: None
IMPRESSION: Chronic atrophic and ischemic changes.

No acute intracranial abnormality noted.

Changes consistent with acute sinusitis as described.

## 2022-09-15 IMAGING — CR DG CHEST 2V
1 series · 2 of 2 positions shown · non-contrast
Comparison: No pertinent prior exams are available for comparison.

CLINICAL DATA: Shortness of breath. Additional history provided:
Cough, fever, forgetfulness, symptoms for 1 week.

EXAM:
CHEST - 2 VIEW

[Series 1: dg chest 2 view · 0.14mm/px · 2 of 2 slices shown]
[im 1/2]
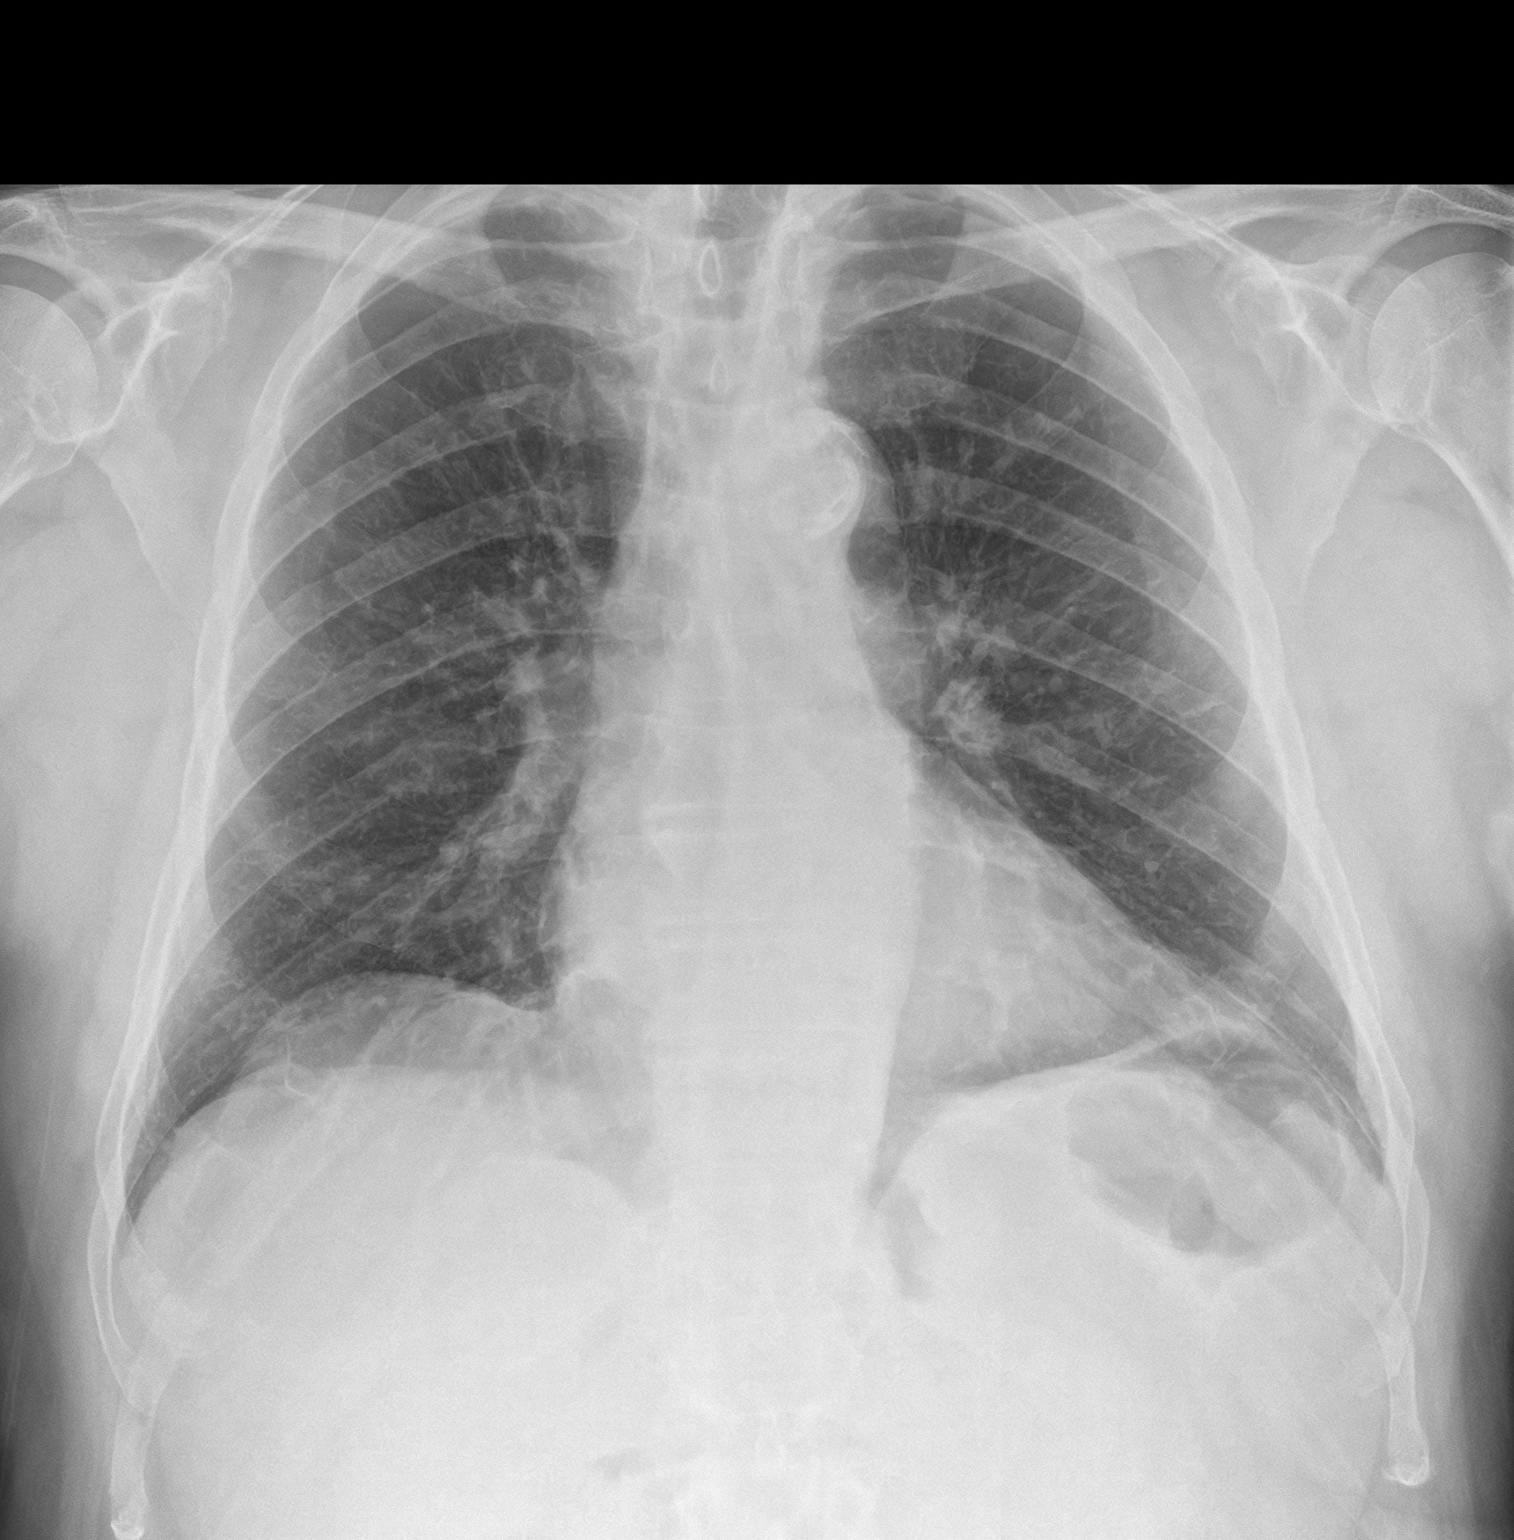
[im 2/2]
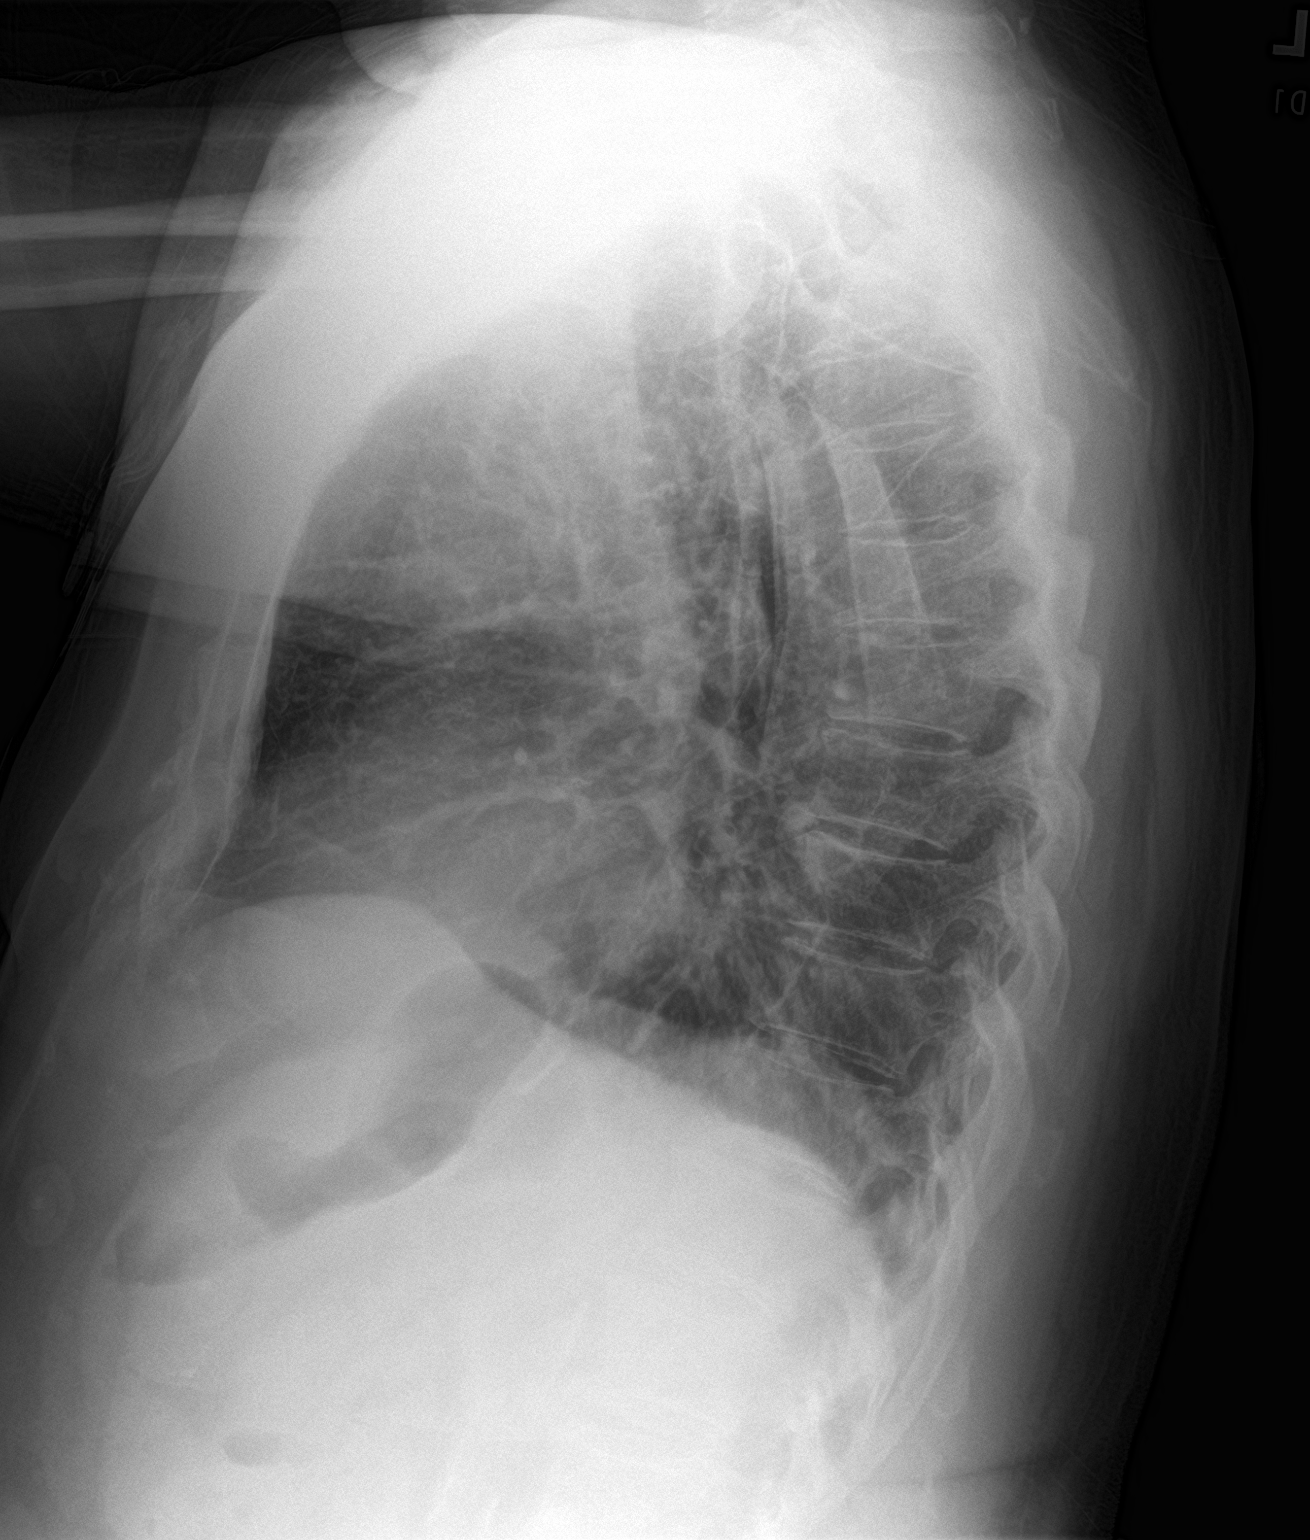

[2 of 2 positions shown; findings below may reference images not displayed]

FINDINGS: Heart size within normal limits. Aortic atherosclerosis. Minimal
atelectasis at the left lung base. No appreciable airspace
consolidation or pulmonary edema. No evidence of pleural effusion or
pneumothorax. No acute bony abnormality identified.
IMPRESSION: Minimal atelectasis at the left lung base. Otherwise, there is no
evidence of acute cardiopulmonary abnormality.

Aortic Atherosclerosis (T2R5H-DXT.T).

## 2022-09-16 ENCOUNTER — Ambulatory Visit (INDEPENDENT_AMBULATORY_CARE_PROVIDER_SITE_OTHER): Payer: Medicare Other | Admitting: Internal Medicine

## 2022-09-16 ENCOUNTER — Encounter: Payer: Self-pay | Admitting: Internal Medicine

## 2022-09-16 VITALS — BP 154/90 | HR 84 | Ht 69.0 in | Wt 213.0 lb

## 2022-09-16 DIAGNOSIS — D508 Other iron deficiency anemias: Secondary | ICD-10-CM

## 2022-09-16 DIAGNOSIS — E782 Mixed hyperlipidemia: Secondary | ICD-10-CM | POA: Diagnosis not present

## 2022-09-16 DIAGNOSIS — I10 Essential (primary) hypertension: Secondary | ICD-10-CM | POA: Diagnosis not present

## 2022-09-16 DIAGNOSIS — N400 Enlarged prostate without lower urinary tract symptoms: Secondary | ICD-10-CM | POA: Diagnosis not present

## 2022-09-16 MED ORDER — AMLODIPINE BESY-BENAZEPRIL HCL 5-20 MG PO CAPS
1.0000 | ORAL_CAPSULE | Freq: Every day | ORAL | 3 refills | Status: DC
Start: 2022-09-16 — End: 2022-12-17

## 2022-09-16 NOTE — Progress Notes (Signed)
Established Patient Office Visit  Subjective:  Patient ID: Dennis Wilkins, male    DOB: 1940/07/08  Age: 82 y.o. MRN: [id number]  Chief Complaint  Patient presents with   Follow-up    2 week follow up    Patient comes in for his follow-up today.  His blood pressure is still high.  At his last visit benazepril 10 mg was added to his Lotrel 5/10.  Patient had injury to his ankle so he rescheduled his follow-up appointment.  He reports that he ran out of medicines and he picked up a new refill of Lotrel 5-10 which he has been taking.  He does not have any more of the benazepril 10 mg/day. In order to avoid confusion we will send in the fresh prescription of Lotrel 5/20 mg which he needs to start taking from today. Patient offers no new complaints whatsoever.    No other concerns at this time.   Past Medical History:  Diagnosis Date   COVID-19 12/01/2019   diagnosed at [facility] on 12/01/2019   Dyspnea    Encephalopathy     Past Surgical History:  Procedure Laterality Date   BACK SURGERY     lower lumbar    Social History   Socioeconomic History   Marital status: Single    Spouse name: Not on file   Number of children: Not on file   Years of education: Not on file   Highest education level: Not on file  Occupational History   Not on file  Tobacco Use   Smoking status: Former   Smokeless tobacco: Never  Vaping Use   Vaping status: Never Used  Substance and Sexual Activity   Alcohol use: Not Currently   Drug use: Never   Sexual activity: Not on file  Other Topics Concern   Not on file  Social History Narrative   Not on file   Social Determinants of Health   Financial Resource Strain: Not on file  Food Insecurity: Not on file  Transportation Needs: Not on file  Physical Activity: Not on file  Stress: Not on file  Social Connections: Not on file  Intimate Partner Violence: Not on file    Family History  Problem Relation Age of Onset   Prostate cancer Neg Hx     Bladder Cancer Neg Hx    Kidney cancer Neg Hx     No Known Allergies  Review of Systems  Constitutional: Negative.   HENT:  Positive for hearing loss. Negative for congestion, nosebleeds, sinus pain and tinnitus.   Eyes: Negative.   Respiratory: Negative.  Negative for cough, shortness of breath and stridor.   Cardiovascular: Negative.  Negative for chest pain, palpitations and leg swelling.  Gastrointestinal: Negative.  Negative for abdominal pain, constipation, diarrhea, heartburn, nausea and vomiting.  Genitourinary: Negative.  Negative for dysuria and flank pain.  Musculoskeletal: Negative.  Negative for joint pain and myalgias.  Skin: Negative.   Neurological: Negative.  Negative for dizziness and headaches.  Endo/Heme/Allergies: Negative.   Psychiatric/Behavioral: Negative.  Negative for depression and suicidal ideas. The patient is not nervous/anxious.        Objective:   BP (!) 154/90   Pulse 84   Ht 5\' 9"  (1.753 m)   Wt 213 lb (96.6 kg)   SpO2 95%   BMI 31.45 kg/m   Vitals:   09/16/22 1035  BP: (!) 154/90  Pulse: 84  Height: 5\' 9"  (1.753 m)  Weight: 213 lb (96.6 kg)  SpO2: 95%  BMI (Calculated): 31.44    Physical Exam Vitals and nursing note reviewed.  Constitutional:      Appearance: Normal appearance.  HENT:     Head: Normocephalic and atraumatic.     Nose: Nose normal.     Mouth/Throat:     Mouth: Mucous membranes are moist.     Pharynx: Oropharynx is clear.  Eyes:     Conjunctiva/sclera: Conjunctivae normal.     Pupils: Pupils are equal, round, and reactive to light.  Cardiovascular:     Rate and Rhythm: Normal rate and regular rhythm.     Pulses: Normal pulses.     Heart sounds: Normal heart sounds.  Pulmonary:     Effort: Pulmonary effort is normal.     Breath sounds: Normal breath sounds.  Abdominal:     General: Bowel sounds are normal. There is no distension.     Palpations: Abdomen is soft. There is no mass.     Tenderness: There is  no abdominal tenderness. There is no right CVA tenderness, left CVA tenderness, guarding or rebound.     Hernia: No hernia is present.  Musculoskeletal:        General: Normal range of motion.     Cervical back: Normal range of motion.  Skin:    General: Skin is warm and dry.  Neurological:     General: No focal deficit present.     Mental Status: He is alert and oriented to person, place, and time.  Psychiatric:        Mood and Affect: Mood normal.        Behavior: Behavior normal.        Judgment: Judgment normal.      No results found for any visits on 09/16/22.  Recent Results (from the past 2160 hour(s))  CMP14+EGFR     Status: Abnormal   Collection Time: 07/25/22 11:48 AM  Result Value Ref Range   Glucose 97 70 - 99 mg/dL   BUN 15 8 - 27 mg/dL   Creatinine, Ser 1.61 0.76 - 1.27 mg/dL   eGFR 70 >09 UE/AVW/0.98   BUN/Creatinine Ratio 14 10 - 24   Sodium 141 134 - 144 mmol/L   Potassium 5.3 (H) 3.5 - 5.2 mmol/L   Chloride 104 96 - 106 mmol/L   CO2 23 20 - 29 mmol/L   Calcium 9.3 8.6 - 10.2 mg/dL   Total Protein 7.0 6.0 - 8.5 g/dL   Albumin 4.4 3.7 - 4.7 g/dL   Globulin, Total 2.6 1.5 - 4.5 g/dL   Albumin/Globulin Ratio 1.7    Bilirubin Total 0.6 0.0 - 1.2 mg/dL   Alkaline Phosphatase 63 44 - 121 IU/L   AST 20 0 - 40 IU/L   ALT 15 0 - 44 IU/L  Lipid Panel w/o Chol/HDL Ratio     Status: Abnormal   Collection Time: 07/25/22 11:48 AM  Result Value Ref Range   Cholesterol, Total 179 100 - 199 mg/dL   Triglycerides 119 0 - 149 mg/dL   HDL 36 (L) >14 mg/dL   VLDL Cholesterol Cal 19 5 - 40 mg/dL   LDL Chol Calc (NIH) 782 (H) 0 - 99 mg/dL  CBC With Differential     Status: Abnormal   Collection Time: 07/25/22 11:48 AM  Result Value Ref Range   WBC 4.5 3.4 - 10.8 x10E3/uL   RBC 4.06 (L) 4.14 - 5.80 x10E6/uL   Hemoglobin 13.6 13.0 - 17.7 g/dL   Hematocrit 38.7  37.5 - 51.0 %   MCV 95 79 - 97 fL   MCH 33.5 (H) 26.6 - 33.0 pg   MCHC 35.1 31.5 - 35.7 g/dL   RDW 00.9  38.1 - 82.9 %   Neutrophils 46 Not Estab. %   Lymphs 38 Not Estab. %   Monocytes 11 Not Estab. %   Eos 4 Not Estab. %   Basos 1 Not Estab. %   Neutrophils Absolute 2.1 1.4 - 7.0 x10E3/uL   Lymphocytes Absolute 1.7 0.7 - 3.1 x10E3/uL   Monocytes Absolute 0.5 0.1 - 0.9 x10E3/uL   EOS (ABSOLUTE) 0.2 0.0 - 0.4 x10E3/uL   Basophils Absolute 0.0 0.0 - 0.2 x10E3/uL   Immature Granulocytes 0 Not Estab. %   Immature Grans (Abs) 0.0 0.0 - 0.1 x10E3/uL    Comment: **Effective September 09, 2022, profile [id number] CBC/Differential**   (No Platelet) will be made non-orderable. [facility] Offers:   [id number] CBC With Differential/Platelet   PSA     Status: Abnormal   Collection Time: 07/25/22 11:48 AM  Result Value Ref Range   Prostate Specific Ag, Serum 7.6 (H) 0.0 - 4.0 ng/mL    Comment: Roche ECLIA methodology. According to the American Urological Association, Serum PSA should decrease and remain at undetectable levels after radical prostatectomy. The AUA defines biochemical recurrence as an initial PSA value 0.2 ng/mL or greater followed by a subsequent confirmatory PSA value 0.2 ng/mL or greater. Values obtained with different assay methods or kits cannot be used interchangeably. Results cannot be interpreted as absolute evidence of the presence or absence of malignant disease.   Potassium     Status: None   Collection Time: 08/01/22 12:15 PM  Result Value Ref Range   Potassium 4.5 3.5 - 5.2 mmol/L      Assessment & Plan:  New prescription sent for Lotrel 5/20.  Patient verbally confirmed understanding. 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    Total time spent: 30 minutes  Margaretann Loveless, MD  09/16/2022   This document may have been prepared by [facility] Voice Recognition software and as such may include unintentional dictation errors.

## 2022-09-26 ENCOUNTER — Ambulatory Visit: Payer: Medicare Other | Admitting: Internal Medicine

## 2022-09-26 ENCOUNTER — Encounter: Payer: Self-pay | Admitting: Internal Medicine

## 2022-09-26 VITALS — BP 128/78 | HR 82 | Ht 69.0 in | Wt 211.4 lb

## 2022-09-26 DIAGNOSIS — D508 Other iron deficiency anemias: Secondary | ICD-10-CM

## 2022-09-26 DIAGNOSIS — E782 Mixed hyperlipidemia: Secondary | ICD-10-CM | POA: Diagnosis not present

## 2022-09-26 DIAGNOSIS — N400 Enlarged prostate without lower urinary tract symptoms: Secondary | ICD-10-CM | POA: Diagnosis not present

## 2022-09-26 DIAGNOSIS — I10 Essential (primary) hypertension: Secondary | ICD-10-CM

## 2022-09-26 NOTE — Progress Notes (Signed)
Established Patient Office Visit  Subjective:  Patient ID: Dennis Wilkins, male    DOB: [birth year]  Age: 82 y.o. MRN: [id number]  Chief Complaint  Patient presents with   Follow-up    10 day follow up    Patient is here for his blood pressure follow-up.  His numbers are looking much better now since he is taking his meds regularly.  He has no other complaints today.    No other concerns at this time.   Past Medical History:  Diagnosis Date   COVID-19 12/01/2019   diagnosed at [facility] on 12/01/2019   Dyspnea    Encephalopathy     Past Surgical History:  Procedure Laterality Date   BACK SURGERY     lower lumbar    Social History   Socioeconomic History   Marital status: Single    Spouse name: Not on file   Number of children: Not on file   Years of education: Not on file   Highest education level: Not on file  Occupational History   Not on file  Tobacco Use   Smoking status: Former   Smokeless tobacco: Never  Vaping Use   Vaping status: Never Used  Substance and Sexual Activity   Alcohol use: Not Currently   Drug use: Never   Sexual activity: Not on file  Other Topics Concern   Not on file  Social History Narrative   Not on file   Social Determinants of Health   Financial Resource Strain: Not on file  Food Insecurity: Not on file  Transportation Needs: Not on file  Physical Activity: Not on file  Stress: Not on file  Social Connections: Not on file  Intimate Partner Violence: Not on file    Family History  Problem Relation Age of Onset   Prostate cancer Neg Hx    Bladder Cancer Neg Hx    Kidney cancer Neg Hx     No Known Allergies  Review of Systems  Constitutional: Negative.  Negative for diaphoresis and malaise/fatigue.  HENT:  Positive for hearing loss. Negative for sinus pain and sore throat.   Eyes: Negative.   Respiratory: Negative.  Negative for cough, shortness of breath and stridor.   Cardiovascular: Negative.  Negative for chest  pain, palpitations and leg swelling.  Gastrointestinal: Negative.  Negative for abdominal pain, constipation, diarrhea, heartburn, nausea and vomiting.  Genitourinary: Negative.  Negative for dysuria and flank pain.  Musculoskeletal: Negative.  Negative for joint pain and myalgias.  Skin: Negative.   Neurological: Negative.  Negative for dizziness, tingling, tremors and headaches.  Endo/Heme/Allergies: Negative.   Psychiatric/Behavioral: Negative.  Negative for depression and suicidal ideas. The patient is not nervous/anxious.        Objective:   BP 128/78   Pulse 82   Ht 5\' 9"  (1.753 m)   Wt 211 lb 6.4 oz (95.9 kg)   SpO2 98%   BMI 31.22 kg/m   Vitals:   09/26/22 1101  BP: 128/78  Pulse: 82  Height: 5\' 9"  (1.753 m)  Weight: 211 lb 6.4 oz (95.9 kg)  SpO2: 98%  BMI (Calculated): 31.2    Physical Exam Vitals and nursing note reviewed.  Constitutional:      Appearance: Normal appearance.  HENT:     Head: Normocephalic and atraumatic.     Nose: Nose normal.     Mouth/Throat:     Mouth: Mucous membranes are moist.     Pharynx: Oropharynx is clear.  Eyes:  Conjunctiva/sclera: Conjunctivae normal.     Pupils: Pupils are equal, round, and reactive to light.  Cardiovascular:     Rate and Rhythm: Normal rate and regular rhythm.     Pulses: Normal pulses.     Heart sounds: Normal heart sounds.  Pulmonary:     Effort: Pulmonary effort is normal. No respiratory distress.     Breath sounds: Normal breath sounds. No stridor. No wheezing, rhonchi or rales.  Abdominal:     General: Bowel sounds are normal.     Palpations: Abdomen is soft.     Tenderness: There is no right CVA tenderness or left CVA tenderness.  Musculoskeletal:        General: Normal range of motion.     Cervical back: Normal range of motion.     Right lower leg: No edema.     Left lower leg: No edema.  Skin:    General: Skin is warm and dry.  Neurological:     General: No focal deficit present.      Mental Status: He is alert and oriented to person, place, and time.  Psychiatric:        Mood and Affect: Mood normal.        Behavior: Behavior normal.        Judgment: Judgment normal.      No results found for any visits on 09/26/22.  Recent Results (from the past 2160 hour(s))  CMP14+EGFR     Status: Abnormal   Collection Time: 07/25/22 11:48 AM  Result Value Ref Range   Glucose 97 70 - 99 mg/dL   BUN 15 8 - 27 mg/dL   Creatinine, Ser 0.73 0.76 - 1.27 mg/dL   eGFR 70 >71 GG/YIR/4.85   BUN/Creatinine Ratio 14 10 - 24   Sodium 141 134 - 144 mmol/L   Potassium 5.3 (H) 3.5 - 5.2 mmol/L   Chloride 104 96 - 106 mmol/L   CO2 23 20 - 29 mmol/L   Calcium 9.3 8.6 - 10.2 mg/dL   Total Protein 7.0 6.0 - 8.5 g/dL   Albumin 4.4 3.7 - 4.7 g/dL   Globulin, Total 2.6 1.5 - 4.5 g/dL   Albumin/Globulin Ratio 1.7    Bilirubin Total 0.6 0.0 - 1.2 mg/dL   Alkaline Phosphatase 63 44 - 121 IU/L   AST 20 0 - 40 IU/L   ALT 15 0 - 44 IU/L  Lipid Panel w/o Chol/HDL Ratio     Status: Abnormal   Collection Time: 07/25/22 11:48 AM  Result Value Ref Range   Cholesterol, Total 179 100 - 199 mg/dL   Triglycerides 462 0 - 149 mg/dL   HDL 36 (L) >70 mg/dL   VLDL Cholesterol Cal 19 5 - 40 mg/dL   LDL Chol Calc (NIH) 350 (H) 0 - 99 mg/dL  CBC With Differential     Status: Abnormal   Collection Time: 07/25/22 11:48 AM  Result Value Ref Range   WBC 4.5 3.4 - 10.8 x10E3/uL   RBC 4.06 (L) 4.14 - 5.80 x10E6/uL   Hemoglobin 13.6 13.0 - 17.7 g/dL   Hematocrit 09.3 81.8 - 51.0 %   MCV 95 79 - 97 fL   MCH 33.5 (H) 26.6 - 33.0 pg   MCHC 35.1 31.5 - 35.7 g/dL   RDW 29.9 37.1 - 69.6 %   Neutrophils 46 Not Estab. %   Lymphs 38 Not Estab. %   Monocytes 11 Not Estab. %   Eos 4 Not Estab. %  Basos 1 Not Estab. %   Neutrophils Absolute 2.1 1.4 - 7.0 x10E3/uL   Lymphocytes Absolute 1.7 0.7 - 3.1 x10E3/uL   Monocytes Absolute 0.5 0.1 - 0.9 x10E3/uL   EOS (ABSOLUTE) 0.2 0.0 - 0.4 x10E3/uL   Basophils  Absolute 0.0 0.0 - 0.2 x10E3/uL   Immature Granulocytes 0 Not Estab. %   Immature Grans (Abs) 0.0 0.0 - 0.1 x10E3/uL    Comment: **Effective September 09, 2022, profile 244010 CBC/Differential**   (No Platelet) will be made non-orderable. [facility] Offers:   [id number] CBC With Differential/Platelet   PSA     Status: Abnormal   Collection Time: 07/25/22 11:48 AM  Result Value Ref Range   Prostate Specific Ag, Serum 7.6 (H) 0.0 - 4.0 ng/mL    Comment: Roche ECLIA methodology. According to the American Urological Association, Serum PSA should decrease and remain at undetectable levels after radical prostatectomy. The AUA defines biochemical recurrence as an initial PSA value 0.2 ng/mL or greater followed by a subsequent confirmatory PSA value 0.2 ng/mL or greater. Values obtained with different assay methods or kits cannot be used interchangeably. Results cannot be interpreted as absolute evidence of the presence or absence of malignant disease.   Potassium     Status: None   Collection Time: 08/01/22 12:15 PM  Result Value Ref Range   Potassium 4.5 3.5 - 5.2 mmol/L      Assessment & Plan:  Continue medications as such.  Will check labs next visit. Problem List Items Addressed This Visit     Absolute anemia   Benign prostatic hyperplasia without lower urinary tract symptoms   Essential hypertension, benign - Primary   Mixed hyperlipidemia    Return in about 2 months (around 11/26/2022).   Total time spent: 25 minutes  Margaretann Loveless, MD  09/26/2022   This document may have been prepared by [facility] Voice Recognition software and as such may include unintentional dictation errors.

## 2022-11-26 ENCOUNTER — Ambulatory Visit: Payer: Medicare Other | Admitting: Internal Medicine

## 2022-11-26 ENCOUNTER — Encounter: Payer: Self-pay | Admitting: Internal Medicine

## 2022-11-26 VITALS — BP 154/80 | HR 86 | Ht 69.0 in | Wt 213.0 lb

## 2022-11-26 DIAGNOSIS — I10 Essential (primary) hypertension: Secondary | ICD-10-CM

## 2022-11-26 DIAGNOSIS — E782 Mixed hyperlipidemia: Secondary | ICD-10-CM

## 2022-11-26 NOTE — Progress Notes (Signed)
Established Patient Office Visit  Subjective:  Patient ID: Dennis Wilkins, male    DOB: 1940-10-17  Age: 82 y.o. MRN: [id number]  Chief Complaint  Patient presents with   Follow-up    2 month follow up    Patient comes in for his follow-up of blood pressure.  It is up again even though he says he took his medications today.  Denies headaches or dizziness, no chest pain and no palpitations.  In general he is feeling quite well.  He is fasting for blood work. Will see him back in 2 to 3 weeks to check blood pressure and adjust his medications if needed.    No other concerns at this time.   Past Medical History:  Diagnosis Date   COVID-19 12/01/2019   diagnosed at [facility] on 12/01/2019   Dyspnea    Encephalopathy     Past Surgical History:  Procedure Laterality Date   BACK SURGERY     lower lumbar    Social History   Socioeconomic History   Marital status: Single    Spouse name: Not on file   Number of children: Not on file   Years of education: Not on file   Highest education level: Not on file  Occupational History   Not on file  Tobacco Use   Smoking status: Former   Smokeless tobacco: Never  Vaping Use   Vaping status: Never Used  Substance and Sexual Activity   Alcohol use: Not Currently   Drug use: Never   Sexual activity: Not on file  Other Topics Concern   Not on file  Social History Narrative   Not on file   Social Determinants of Health   Financial Resource Strain: Not on file  Food Insecurity: Not on file  Transportation Needs: Not on file  Physical Activity: Not on file  Stress: Not on file  Social Connections: Not on file  Intimate Partner Violence: Not on file    Family History  Problem Relation Age of Onset   Prostate cancer Neg Hx    Bladder Cancer Neg Hx    Kidney cancer Neg Hx     No Known Allergies  Review of Systems  Constitutional: Negative.  Negative for chills, diaphoresis, fever and malaise/fatigue.  HENT:  Positive for  hearing loss. Negative for congestion, sinus pain and tinnitus.   Eyes: Negative.   Respiratory: Negative.  Negative for cough, shortness of breath and stridor.   Cardiovascular: Negative.  Negative for chest pain, palpitations and leg swelling.  Gastrointestinal: Negative.  Negative for abdominal pain, constipation, diarrhea, heartburn, nausea and vomiting.  Genitourinary: Negative.  Negative for dysuria and flank pain.  Musculoskeletal: Negative.  Negative for joint pain and myalgias.  Skin: Negative.   Neurological: Negative.  Negative for dizziness, tingling, tremors and headaches.  Endo/Heme/Allergies: Negative.   Psychiatric/Behavioral: Negative.  Negative for depression and suicidal ideas. The patient is not nervous/anxious.        Objective:   BP (!) 154/80   Pulse 86   Ht 5\' 9"  (1.753 m)   Wt 213 lb (96.6 kg)   SpO2 97%   BMI 31.45 kg/m   Vitals:   11/26/22 1034  BP: (!) 154/80  Pulse: 86  Height: 5\' 9"  (1.753 m)  Weight: 213 lb (96.6 kg)  SpO2: 97%  BMI (Calculated): 31.44    Physical Exam Vitals and nursing note reviewed.  Constitutional:      Appearance: Normal appearance.  HENT:  Head: Normocephalic and atraumatic.     Nose: Nose normal.     Mouth/Throat:     Mouth: Mucous membranes are moist.     Pharynx: Oropharynx is clear.  Eyes:     Conjunctiva/sclera: Conjunctivae normal.     Pupils: Pupils are equal, round, and reactive to light.  Cardiovascular:     Rate and Rhythm: Normal rate and regular rhythm.     Pulses: Normal pulses.     Heart sounds: Normal heart sounds.  Pulmonary:     Effort: Pulmonary effort is normal.     Breath sounds: Normal breath sounds.  Abdominal:     General: Bowel sounds are normal.     Palpations: Abdomen is soft.  Musculoskeletal:        General: Normal range of motion.     Cervical back: Normal range of motion.  Skin:    General: Skin is warm and dry.  Neurological:     General: No focal deficit present.      Mental Status: He is alert and oriented to person, place, and time.  Psychiatric:        Mood and Affect: Mood normal.        Behavior: Behavior normal.        Judgment: Judgment normal.      No results found for any visits on 11/26/22.  No results found for this or any previous visit (from the past 2160 hour(s)).    Assessment & Plan:  Check labs today. Monitor blood pressure at home and bring record.  Will adjust medicines if needed. Problem List Items Addressed This Visit     Essential hypertension, benign - Primary   Relevant Orders   Lipid Panel w/o Chol/HDL Ratio   Mixed hyperlipidemia   Relevant Orders   CMP14+EGFR    Return in about 3 weeks (around 12/17/2022).   Total time spent: 30 minutes  Margaretann Loveless, MD  11/26/2022   This document may have been prepared by [facility] Voice Recognition software and as such may include unintentional dictation errors.

## 2022-11-27 LAB — CMP14+EGFR
ALT: 13 [IU]/L (ref 0–44)
AST: 20 [IU]/L (ref 0–40)
Albumin: 4.5 g/dL (ref 3.7–4.7)
Alkaline Phosphatase: 55 [IU]/L (ref 44–121)
BUN/Creatinine Ratio: 16 (ref 10–24)
BUN: 19 mg/dL (ref 8–27)
Bilirubin Total: 0.9 mg/dL (ref 0.0–1.2)
CO2: 24 mmol/L (ref 20–29)
Calcium: 9.6 mg/dL (ref 8.6–10.2)
Chloride: 104 mmol/L (ref 96–106)
Creatinine, Ser: 1.16 mg/dL (ref 0.76–1.27)
Globulin, Total: 2.3 g/dL (ref 1.5–4.5)
Glucose: 105 mg/dL — ABNORMAL HIGH (ref 70–99)
Potassium: 5.3 mmol/L — ABNORMAL HIGH (ref 3.5–5.2)
Sodium: 142 mmol/L (ref 134–144)
Total Protein: 6.8 g/dL (ref 6.0–8.5)
eGFR: 62 mL/min/{1.73_m2} (ref >59)

## 2022-11-27 LAB — LIPID PANEL W/O CHOL/HDL RATIO
Cholesterol, Total: 189 mg/dL (ref 100–199)
HDL: 35 mg/dL — ABNORMAL LOW (ref >39)
LDL Chol Calc (NIH): 130 mg/dL — ABNORMAL HIGH (ref 0–99)
Triglycerides: 131 mg/dL (ref 0–149)
VLDL Cholesterol Cal: 24 mg/dL (ref 5–40)

## 2022-11-28 ENCOUNTER — Other Ambulatory Visit: Payer: Self-pay | Admitting: Internal Medicine

## 2022-11-28 DIAGNOSIS — E782 Mixed hyperlipidemia: Secondary | ICD-10-CM

## 2022-11-28 MED ORDER — ROSUVASTATIN CALCIUM 10 MG PO TABS
10.0000 mg | ORAL_TABLET | Freq: Every day | ORAL | 2 refills | Status: DC
Start: 2022-11-28 — End: 2022-12-17

## 2022-12-17 ENCOUNTER — Ambulatory Visit (INDEPENDENT_AMBULATORY_CARE_PROVIDER_SITE_OTHER): Payer: Medicare Other | Admitting: Internal Medicine

## 2022-12-17 ENCOUNTER — Encounter: Payer: Self-pay | Admitting: Internal Medicine

## 2022-12-17 VITALS — BP 152/86 | HR 83 | Ht 69.0 in | Wt 209.8 lb

## 2022-12-17 DIAGNOSIS — I10 Essential (primary) hypertension: Secondary | ICD-10-CM

## 2022-12-17 DIAGNOSIS — E782 Mixed hyperlipidemia: Secondary | ICD-10-CM

## 2022-12-17 MED ORDER — HYDROCHLOROTHIAZIDE 12.5 MG PO TABS
12.5000 mg | ORAL_TABLET | Freq: Every day | ORAL | 3 refills | Status: DC
Start: 2022-12-17 — End: 2023-03-21

## 2022-12-17 MED ORDER — AMLODIPINE BESY-BENAZEPRIL HCL 10-20 MG PO CAPS: 1.0000 | ORAL_CAPSULE | Freq: Every day | ORAL | 3 refills | Status: DC

## 2022-12-17 MED ORDER — ROSUVASTATIN CALCIUM 10 MG PO TABS
10.0000 mg | ORAL_TABLET | Freq: Every day | ORAL | 2 refills | Status: DC
Start: 2022-12-17 — End: 2023-12-10

## 2022-12-17 NOTE — Progress Notes (Signed)
Established Patient Office Visit  Subjective:  Patient ID: Dennis Wilkins, male    DOB: 1940/06/19  Age: 82 y.o. MRN: [id number]  Chief Complaint  Patient presents with   Follow-up    3 week follow up    Patient comes in for his blood pressure follow-up.  With still high at 152/86, even though he is taking his Lotrel 5/20 regularly.  His home blood pressure readings are also elevated. His recent labs also showed elevated LDL and he has been advised to start taking small dose of Crestor 10 mg/day, which she will start now. Will adjust his blood pressure medication further by switching to Lotrel 10/20 and add hydrochlorothiazide 12.5 mg/day.    No other concerns at this time.   Past Medical History:  Diagnosis Date   COVID-19 12/01/2019   diagnosed at [facility] on 12/01/2019   Dyspnea    Encephalopathy     Past Surgical History:  Procedure Laterality Date   BACK SURGERY     lower lumbar    Social History   Socioeconomic History   Marital status: Single    Spouse name: Not on file   Number of children: Not on file   Years of education: Not on file   Highest education level: Not on file  Occupational History   Not on file  Tobacco Use   Smoking status: Former   Smokeless tobacco: Never  Vaping Use   Vaping status: Never Used  Substance and Sexual Activity   Alcohol use: Not Currently   Drug use: Never   Sexual activity: Not on file  Other Topics Concern   Not on file  Social History Narrative   Not on file   Social Determinants of Health   Financial Resource Strain: Not on file  Food Insecurity: Not on file  Transportation Needs: Not on file  Physical Activity: Not on file  Stress: Not on file  Social Connections: Not on file  Intimate Partner Violence: Not on file    Family History  Problem Relation Age of Onset   Prostate cancer Neg Hx    Bladder Cancer Neg Hx    Kidney cancer Neg Hx     No Known Allergies  Review of Systems  Constitutional:  Negative.  Negative for chills, diaphoresis, fever, malaise/fatigue and weight loss.  HENT:  Positive for hearing loss. Negative for ear discharge, ear pain, sore throat and tinnitus.   Eyes: Negative.   Respiratory: Negative.  Negative for cough and shortness of breath.   Cardiovascular: Negative.  Negative for chest pain, palpitations and leg swelling.  Gastrointestinal: Negative.  Negative for abdominal pain, constipation, diarrhea, heartburn, nausea and vomiting.  Genitourinary: Negative.  Negative for dysuria and flank pain.  Musculoskeletal: Negative.  Negative for joint pain and myalgias.  Skin: Negative.   Neurological: Negative.  Negative for dizziness and headaches.  Endo/Heme/Allergies: Negative.   Psychiatric/Behavioral: Negative.  Negative for depression and suicidal ideas. The patient is not nervous/anxious.        Objective:   BP (!) 152/86   Pulse 83   Ht 5\' 9"  (1.753 m)   Wt 209 lb 12.8 oz (95.2 kg)   SpO2 97%   BMI 30.98 kg/m   Vitals:   12/17/22 1030  BP: (!) 152/86  Pulse: 83  Height: 5\' 9"  (1.753 m)  Weight: 209 lb 12.8 oz (95.2 kg)  SpO2: 97%  BMI (Calculated): 30.97    Physical Exam Vitals and nursing note reviewed.  Constitutional:  Appearance: Normal appearance.  HENT:     Head: Normocephalic and atraumatic.     Nose: Nose normal.     Mouth/Throat:     Mouth: Mucous membranes are moist.     Pharynx: Oropharynx is clear.  Eyes:     Conjunctiva/sclera: Conjunctivae normal.     Pupils: Pupils are equal, round, and reactive to light.  Cardiovascular:     Rate and Rhythm: Normal rate and regular rhythm.     Pulses: Normal pulses.     Heart sounds: Normal heart sounds.  Pulmonary:     Effort: Pulmonary effort is normal.     Breath sounds: Normal breath sounds.  Abdominal:     General: Bowel sounds are normal.     Palpations: Abdomen is soft.  Musculoskeletal:        General: Normal range of motion.     Cervical back: Normal range of  motion.  Skin:    General: Skin is warm and dry.  Neurological:     General: No focal deficit present.     Mental Status: He is alert and oriented to person, place, and time.  Psychiatric:        Mood and Affect: Mood normal.        Behavior: Behavior normal.        Judgment: Judgment normal.      No results found for any visits on 12/17/22.  Recent Results (from the past 2160 hour(s))  CMP14+EGFR     Status: Abnormal   Collection Time: 11/26/22 11:03 AM  Result Value Ref Range   Glucose 105 (H) 70 - 99 mg/dL   BUN 19 8 - 27 mg/dL   Creatinine, Ser 1.61 0.76 - 1.27 mg/dL   eGFR 62 >09 UE/AVW/0.98   BUN/Creatinine Ratio 16 10 - 24   Sodium 142 134 - 144 mmol/L   Potassium 5.3 (H) 3.5 - 5.2 mmol/L   Chloride 104 96 - 106 mmol/L   CO2 24 20 - 29 mmol/L   Calcium 9.6 8.6 - 10.2 mg/dL   Total Protein 6.8 6.0 - 8.5 g/dL   Albumin 4.5 3.7 - 4.7 g/dL   Globulin, Total 2.3 1.5 - 4.5 g/dL   Bilirubin Total 0.9 0.0 - 1.2 mg/dL   Alkaline Phosphatase 55 44 - 121 IU/L   AST 20 0 - 40 IU/L   ALT 13 0 - 44 IU/L  Lipid Panel w/o Chol/HDL Ratio     Status: Abnormal   Collection Time: 11/26/22 11:03 AM  Result Value Ref Range   Cholesterol, Total 189 100 - 199 mg/dL   Triglycerides 119 0 - 149 mg/dL   HDL 35 (L) >14 mg/dL   VLDL Cholesterol Cal 24 5 - 40 mg/dL   LDL Chol Calc (NIH) 782 (H) 0 - 99 mg/dL      Assessment & Plan:  Increase Lotrel to 10/20.  Add hydrochlorothiazide. Start Crestor for high LDL. Continue to monitor blood pressure at home. Problem List Items Addressed This Visit     Essential hypertension, benign - Primary   Relevant Medications   amLODipine-benazepril (LOTREL) 10-20 MG capsule   hydrochlorothiazide (HYDRODIURIL) 12.5 MG tablet   rosuvastatin (CRESTOR) 10 MG tablet   Mixed hyperlipidemia   Relevant Medications   amLODipine-benazepril (LOTREL) 10-20 MG capsule   hydrochlorothiazide (HYDRODIURIL) 12.5 MG tablet   rosuvastatin (CRESTOR) 10 MG tablet     Return in about 3 weeks (around 01/07/2023).   Total time spent: 30 minutes  Margaretann Loveless,  MD  12/17/2022   This document may have been prepared by [facility] Voice Recognition software and as such may include unintentional dictation errors.

## 2023-01-07 ENCOUNTER — Ambulatory Visit (INDEPENDENT_AMBULATORY_CARE_PROVIDER_SITE_OTHER): Payer: Medicare Other | Admitting: Family

## 2023-01-07 ENCOUNTER — Encounter: Payer: Self-pay | Admitting: Internal Medicine

## 2023-01-07 VITALS — BP 120/60 | HR 86 | Ht 69.0 in | Wt 210.8 lb

## 2023-01-07 DIAGNOSIS — Z0000 Encounter for general adult medical examination without abnormal findings: Secondary | ICD-10-CM

## 2023-01-12 NOTE — Progress Notes (Signed)
Annual Wellness Visit  Patient: Dennis Wilkins, Male    DOB: Nov 17, 1940, 82 y.o.   MRN: [id number] Visit Date: 01/12/2023  Today's Provider: Miki Kins, FNP  Subjective:    Chief Complaint  Patient presents with   Annual Exam    awv   Dennis Wilkins is a 82 y.o. male who presents today for his Annual Wellness Visit.  Past Medical History:  Diagnosis Date   COVID-19 12/01/2019   diagnosed at [facility] on 12/01/2019   Dyspnea    Encephalopathy    Past Surgical History:  Procedure Laterality Date   BACK SURGERY     lower lumbar   Family History  Problem Relation Age of Onset   Prostate cancer Neg Hx    Bladder Cancer Neg Hx    Kidney cancer Neg Hx    Social History   Socioeconomic History   Marital status: Single    Spouse name: Not on file   Number of children: Not on file   Years of education: Not on file   Highest education level: Not on file  Occupational History   Not on file  Tobacco Use   Smoking status: Former   Smokeless tobacco: Never  Vaping Use   Vaping status: Never Used  Substance and Sexual Activity   Alcohol use: Not Currently   Drug use: Never   Sexual activity: Not on file  Other Topics Concern   Not on file  Social History Narrative   Not on file   Social Determinants of Health   Financial Resource Strain: Low Risk  (01/07/2023)   Overall Financial Resource Strain (CARDIA)    Difficulty of Paying Living Expenses: Not hard at all  Food Insecurity: No Food Insecurity (01/07/2023)   Hunger Vital Sign    Worried About Running Out of Food in the Last Year: Never true    Ran Out of Food in the Last Year: Never true  Transportation Needs: No Transportation Needs (01/07/2023)   PRAPARE - Administrator, Civil Service (Medical): No    Lack of Transportation (Non-Medical): No  Physical Activity: Not on file  Stress: No Stress Concern Present (01/07/2023)   [employer] of Occupational Health - Occupational Stress  Questionnaire    Feeling of Stress : Not at all  Social Connections: Not on file  Intimate Partner Violence: Not At Risk (01/07/2023)   Humiliation, Afraid, Rape, and Kick questionnaire    Fear of Current or Ex-Partner: No    Emotionally Abused: No    Physically Abused: No    Sexually Abused: No    Medications: Outpatient Medications Prior to Visit  Medication Sig   amLODipine-benazepril (LOTREL) 10-20 MG capsule Take 1 capsule by mouth daily.   ascorbic acid (VITAMIN C) 500 MG tablet Take 1 tablet (500 mg total) by mouth daily.   ferrous sulfate 325 (65 FE) MG tablet Take 1 tablet (325 mg total) by mouth daily with breakfast.   hydrochlorothiazide (HYDRODIURIL) 12.5 MG tablet Take 1 tablet (12.5 mg total) by mouth daily.   rosuvastatin (CRESTOR) 10 MG tablet Take 1 tablet (10 mg total) by mouth daily.   tamsulosin (FLOMAX) 0.4 MG CAPS capsule TAKE ONE CAPSULE BY MOUTH ONE TIME DAILY   zinc sulfate 220 (50 Zn) MG capsule Take 1 capsule (220 mg total) by mouth daily.   cholecalciferol (VITAMIN D) 25 MCG tablet Take 1 tablet (1,000 Units total) by mouth daily. (Patient not taking: Reported on 01/07/2023)  No facility-administered medications prior to visit.    No Known Allergies  Patient Care Team: Margaretann Loveless, MD as PCP - General (Internal Medicine)  Review of Systems  All other systems reviewed and are negative.      Objective:    Vitals: BP 120/60   Pulse 86   Ht 5\' 9"  (1.753 m)   Wt 210 lb 12.8 oz (95.6 kg)   SpO2 97%   BMI 31.13 kg/m    Physical Exam Vitals and nursing note reviewed.  Constitutional:      Appearance: Normal appearance. He is normal weight.  Eyes:     Pupils: Pupils are equal, round, and reactive to light.  Cardiovascular:     Rate and Rhythm: Normal rate and regular rhythm.     Pulses: Normal pulses.     Heart sounds: Normal heart sounds.  Pulmonary:     Effort: Pulmonary effort is normal.     Breath sounds: Normal breath sounds.   Neurological:     Mental Status: He is alert.  Psychiatric:        Mood and Affect: Mood normal.        Behavior: Behavior normal.      Most recent functional status assessment:    01/07/2023   12:37 PM  In your present state of health, do you have any difficulty performing the following activities:  Hearing? 1  Vision? 0  Difficulty concentrating or making decisions? 0  Walking or climbing stairs? 0  Dressing or bathing? 0  Doing errands, shopping? 0  Preparing Food and eating ? N  Using the Toilet? N  In the past six months, have you accidently leaked urine? N  Do you have problems with loss of bowel control? N  Managing your Medications? N  Managing your Finances? N  Housekeeping or managing your Housekeeping? N    Most recent fall risk assessment:    01/12/2023    8:24 PM  Fall Risk   Falls in the past year? 0  Number falls in past yr: 0  Injury with Fall? 0     Most recent depression screenings:    01/07/2023   12:36 PM  PHQ 2/9 Scores  PHQ - 2 Score 0    Most recent cognitive screening:    01/07/2023   12:33 PM  6CIT Screen  What Year? 0 points  What month? 0 points  What time? 0 points  Count back from 20 0 points  Months in reverse 4 points  Repeat phrase 4 points  Total Score 8 points    No results found for any visits on 01/07/23.     Assessment & Plan:      Annual wellness visit done today including the all of the following: Reviewed patient's Family Medical History Reviewed and updated list of patient's medical providers Assessment of cognitive impairment was done Assessed patient's functional ability Established a written schedule for health screening services Health Risk Assessent Completed and Reviewed  Exercise Activities and Dietary recommendations  Goals      Absence of Fall and Fall-Related Injury     Evidence-based guidance:  Assess fall risk using a validated tool when available. Consider balance and gait  impairment, muscle weakness, diminished vision or hearing, environmental hazards, presence of urinary or bowel urgency and/or incontinence.  Communicate fall injury risk to interprofessional healthcare team.  Develop a fall prevention plan with the patient and family.  Promote use of personal vision and auditory aids.  Promote  reorientation, appropriate sensory stimulation, and routines to decrease risk of fall when changes in mental status are present.  Assess assistance level required for safe and effective self-care; consider referral for home care.  Encourage physical activity, such as performance of self-care at highest level of ability, strength and balance exercise program, and provision of appropriate assistive devices; refer to rehabilitation therapy.  Refer to community-based fall prevention program where available.  If fall occurs, determine the cause and revise fall injury prevention plan.  Regularly review medication contribution to fall risk; consider risk related to polypharmacy and age.  Refer to pharmacist for consultation when concerns about medications are revealed.  Balance adequate pain management with potential for oversedation.  Provide guidance related to environmental modifications.  Consider supplementation with Vitamin D.   Notes:      Maintain Mobility and Function     Evidence-based guidance:  Acknowledge and validate impact of pain, loss of strength and potential disfigurement (hand osteoarthritis) on mental health and daily life, such as social isolation, anxiety, depression, impaired sexual relationship and   injury from falls.  Anticipate referral to physical or occupational therapy for assessment, therapeutic exercise and recommendation for adaptive equipment or assistive devices; encourage participation.  Assess impact on ability to perform activities of daily living, as well as engage in sports and leisure events or requirements of work or school.  Provide  anticipatory guidance and reassurance about the benefit of exercise to maintain function; acknowledge and normalize fear that exercise may worsen symptoms.  Encourage regular exercise, at least 10 minutes at a time for 45 minutes per week; consider yoga, water exercise and proprioceptive exercises; encourage use of wearable activity tracker to increase motivation and adherence.  Encourage maintenance or resumption of daily activities, including employment, as pain allows and with minimal exposure to trauma.  Assist patient to advocate for adaptations to the work environment.  Consider level of pain and function, gender, age, lifestyle, patient preference, quality of life, readiness and ?ocapacity to benefit? when recommending patients for orthopaedic surgery consultation.  Explore strategies, such as changes to medication regimen or activity that enables patient to anticipate and manage flare-ups that increase deconditioning and disability.  Explore patient preferences; encourage exposure to a broader range of activities that have been avoided for fear of experiencing pain.  Identify barriers to participation in therapy or exercise, such as pain with activity, anticipated or imagined pain.  Monitor postoperative joint replacement or any preexisting joint replacement for ongoing pain and loss of function; provide social support and encouragement throughout recovery.   Notes:          There is no immunization history on file for this patient.  Health Maintenance  Topic Date Due   DTaP/Tdap/Td (1 - Tdap) Never done   Zoster Vaccines- Shingrix (1 of 2) Never done   Pneumonia Vaccine 70+ Years old (1 of 1 - PCV) Never done   COVID-19 Vaccine (1 - 2023-24 season) Never done   Medicare Annual Wellness (AWV)  01/07/2024   HPV VACCINES  Aged Out   INFLUENZA VACCINE  Discontinued     Discussed health benefits of physical activity, and encouraged him to engage in regular exercise appropriate  for his age and condition.         Miki Kins, FNP   01/07/2023  This document may have been prepared by Dragon Voice Recognition software and as such may include unintentional dictation errors.

## 2023-03-20 ENCOUNTER — Other Ambulatory Visit: Payer: Self-pay | Admitting: Internal Medicine

## 2023-03-20 DIAGNOSIS — I10 Essential (primary) hypertension: Secondary | ICD-10-CM

## 2023-04-10 ENCOUNTER — Encounter: Payer: Self-pay | Admitting: Cardiology

## 2023-04-10 ENCOUNTER — Ambulatory Visit: Payer: Medicare Other | Admitting: Cardiology

## 2023-04-10 VITALS — BP 114/52 | HR 81 | Ht 69.0 in | Wt 205.0 lb

## 2023-04-10 DIAGNOSIS — I10 Essential (primary) hypertension: Secondary | ICD-10-CM | POA: Diagnosis not present

## 2023-04-10 DIAGNOSIS — N400 Enlarged prostate without lower urinary tract symptoms: Secondary | ICD-10-CM

## 2023-04-10 DIAGNOSIS — E782 Mixed hyperlipidemia: Secondary | ICD-10-CM | POA: Diagnosis not present

## 2023-04-10 NOTE — Progress Notes (Signed)
 Established Patient Office Visit  Subjective:  Patient ID: Dennis Wilkins, male    DOB: [birth year]  Age: 83 y.o. MRN: [id number]  Chief Complaint  Patient presents with   Follow-up    3 Months Follow Up    Patient in office for 3 month follow up. Patient doing well, no complaints today. Patient taking and tolerating medications.  Blood pressure well controlled today.  Patient has not seen urology since 04/2020. Will send referral for follow up appointment.     No other concerns at this time.   Past Medical History:  Diagnosis Date   COVID-19 12/01/2019   diagnosed at [facility] on 12/01/2019   Dyspnea    Encephalopathy     Past Surgical History:  Procedure Laterality Date   BACK SURGERY     lower lumbar    Social History   Socioeconomic History   Marital status: Single    Spouse name: Not on file   Number of children: Not on file   Years of education: Not on file   Highest education level: Not on file  Occupational History   Not on file  Tobacco Use   Smoking status: Former   Smokeless tobacco: Never  Vaping Use   Vaping status: Never Used  Substance and Sexual Activity   Alcohol use: Not Currently   Drug use: Never   Sexual activity: Not on file  Other Topics Concern   Not on file  Social History Narrative   Not on file   Social Drivers of Health   Financial Resource Strain: Low Risk  (01/07/2023)   Overall Financial Resource Strain (CARDIA)    Difficulty of Paying Living Expenses: Not hard at all  Food Insecurity: No Food Insecurity (01/07/2023)   Hunger Vital Sign    Worried About Running Out of Food in the Last Year: Never true    Ran Out of Food in the Last Year: Never true  Transportation Needs: No Transportation Needs (01/07/2023)   PRAPARE - Administrator, Civil Service (Medical): No    Lack of Transportation (Non-Medical): No  Physical Activity: Not on file  Stress: No Stress Concern Present (01/07/2023)   [employer] of  Occupational Health - Occupational Stress Questionnaire    Feeling of Stress : Not at all  Social Connections: Not on file  Intimate Partner Violence: Not At Risk (01/07/2023)   Humiliation, Afraid, Rape, and Kick questionnaire    Fear of Current or Ex-Partner: No    Emotionally Abused: No    Physically Abused: No    Sexually Abused: No    Family History  Problem Relation Age of Onset   Prostate cancer Neg Hx    Bladder Cancer Neg Hx    Kidney cancer Neg Hx     No Known Allergies  Outpatient Medications Prior to Visit  Medication Sig   amLODipine-benazepril (LOTREL) 10-20 MG capsule TAKE ONE CAPSULE BY MOUTH ONE TIME DAILY   ascorbic acid (VITAMIN C) 500 MG tablet Take 1 tablet (500 mg total) by mouth daily.   ferrous sulfate 325 (65 FE) MG tablet Take 1 tablet (325 mg total) by mouth daily with breakfast.   hydrochlorothiazide (HYDRODIURIL) 12.5 MG tablet TAKE ONE TABLET BY MOUTH ONE TIME DAILY   rosuvastatin (CRESTOR) 10 MG tablet Take 1 tablet (10 mg total) by mouth daily.   tamsulosin (FLOMAX) 0.4 MG CAPS capsule TAKE ONE CAPSULE BY MOUTH ONE TIME DAILY   zinc sulfate 220 (50 Zn) MG  capsule Take 1 capsule (220 mg total) by mouth daily.   [DISCONTINUED] cholecalciferol (VITAMIN D) 25 MCG tablet Take 1 tablet (1,000 Units total) by mouth daily. (Patient not taking: Reported on 01/07/2023)   No facility-administered medications prior to visit.    Review of Systems  Constitutional: Negative.   HENT: Negative.    Eyes: Negative.   Respiratory: Negative.  Negative for shortness of breath.   Cardiovascular: Negative.  Negative for chest pain.  Gastrointestinal: Negative.  Negative for abdominal pain, constipation and diarrhea.  Genitourinary: Negative.   Musculoskeletal:  Negative for joint pain and myalgias.  Skin: Negative.   Neurological: Negative.  Negative for dizziness and headaches.  Endo/Heme/Allergies: Negative.   All other systems reviewed and are negative.       Objective:   BP (!) 114/52   Pulse 81   Ht 5\' 9"  (1.753 m)   Wt 205 lb (93 kg)   SpO2 96%   BMI 30.27 kg/m   Vitals:   04/10/23 1046  BP: (!) 114/52  Pulse: 81  Height: 5\' 9"  (1.753 m)  Weight: 205 lb (93 kg)  SpO2: 96%  BMI (Calculated): 30.26    Physical Exam Nursing note reviewed.  Constitutional:      Appearance: Normal appearance. He is normal weight.  HENT:     Head: Normocephalic and atraumatic.     Nose: Nose normal.     Mouth/Throat:     Mouth: Mucous membranes are moist.     Pharynx: Oropharynx is clear.  Eyes:     Extraocular Movements: Extraocular movements intact.     Conjunctiva/sclera: Conjunctivae normal.     Pupils: Pupils are equal, round, and reactive to light.  Cardiovascular:     Rate and Rhythm: Normal rate and regular rhythm.     Pulses: Normal pulses.     Heart sounds: Normal heart sounds.  Pulmonary:     Effort: Pulmonary effort is normal.     Breath sounds: Normal breath sounds.  Abdominal:     General: Abdomen is flat. Bowel sounds are normal.     Palpations: Abdomen is soft.  Musculoskeletal:        General: Normal range of motion.     Cervical back: Normal range of motion.  Skin:    General: Skin is warm and dry.  Neurological:     General: No focal deficit present.     Mental Status: He is alert and oriented to person, place, and time.  Psychiatric:        Mood and Affect: Mood normal.        Behavior: Behavior normal.        Thought Content: Thought content normal.        Judgment: Judgment normal.      No results found for any visits on 04/10/23.  No results found for this or any previous visit (from the past 2160 hours).    Assessment & Plan:  Continue same medications.  Referral sent to urology.   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   Total time spent: 25 minutes  Google, NP  04/10/2023  This document may have been prepared by [employer] Voice Recognition software and as such may include unintentional dictation errors.

## 2023-05-05 ENCOUNTER — Encounter: Payer: Self-pay | Admitting: Physician Assistant

## 2023-05-05 ENCOUNTER — Ambulatory Visit: Admitting: Physician Assistant

## 2023-06-05 ENCOUNTER — Other Ambulatory Visit: Payer: Self-pay

## 2023-06-05 DIAGNOSIS — I10 Essential (primary) hypertension: Secondary | ICD-10-CM

## 2023-06-05 MED ORDER — AMLODIPINE BESY-BENAZEPRIL HCL 10-20 MG PO CAPS: 1.0000 | ORAL_CAPSULE | Freq: Every day | ORAL | 2 refills | Status: DC

## 2023-06-18 ENCOUNTER — Ambulatory Visit: Admitting: Urology

## 2023-08-07 ENCOUNTER — Ambulatory Visit (INDEPENDENT_AMBULATORY_CARE_PROVIDER_SITE_OTHER): Admitting: Cardiology

## 2023-08-07 ENCOUNTER — Encounter: Payer: Self-pay | Admitting: Cardiology

## 2023-08-07 VITALS — BP 116/71 | HR 87 | Ht 69.0 in | Wt 201.4 lb

## 2023-08-07 DIAGNOSIS — E782 Mixed hyperlipidemia: Secondary | ICD-10-CM | POA: Diagnosis not present

## 2023-08-07 DIAGNOSIS — E875 Hyperkalemia: Secondary | ICD-10-CM

## 2023-08-07 DIAGNOSIS — I10 Essential (primary) hypertension: Secondary | ICD-10-CM | POA: Diagnosis not present

## 2023-08-07 DIAGNOSIS — H04129 Dry eye syndrome of unspecified lacrimal gland: Secondary | ICD-10-CM

## 2023-08-07 NOTE — Progress Notes (Signed)
 Established Patient Office Visit  Subjective:  Patient ID: Dennis Wilkins, male    DOB: Jun 30, 1940  Age: 83 y.o. MRN: [id number]  Chief Complaint  Patient presents with   Follow-up    4 month follow up    Patient in office for 4 month follow up, did not have lab work done. Patient doing well. Reports having leg cramps yesterday. Potassium elevated at last check, will recheck today, along with magnesium . Patient reports drinking plenty of water in this heat, went golfing last week, had orthostatic dizziness. Discussed rising slowly, drinking occasional electrolyte replacement such as body armour.  Patient complaining of dry eyes. Has not seen ophthalmology in over 3 years. Will send a referral.     No other concerns at this time.   Past Medical History:  Diagnosis Date   COVID-19 12/01/2019   diagnosed at [facility] on 12/01/2019   Dyspnea    Encephalopathy     Past Surgical History:  Procedure Laterality Date   BACK SURGERY     lower lumbar    Social History   Socioeconomic History   Marital status: Single    Spouse name: Not on file   Number of children: Not on file   Years of education: Not on file   Highest education level: Not on file  Occupational History   Not on file  Tobacco Use   Smoking status: Former   Smokeless tobacco: Never  Vaping Use   Vaping status: Never Used  Substance and Sexual Activity   Alcohol use: Not Currently   Drug use: Never   Sexual activity: Not on file  Other Topics Concern   Not on file  Social History Narrative   Not on file   Social Drivers of Health   Financial Resource Strain: Low Risk  (01/07/2023)   Overall Financial Resource Strain (CARDIA)    Difficulty of Paying Living Expenses: Not hard at all  Food Insecurity: No Food Insecurity (01/07/2023)   Hunger Vital Sign    Worried About Running Out of Food in the Last Year: Never true    Ran Out of Food in the Last Year: Never true  Transportation Needs: No Transportation  Needs (01/07/2023)   PRAPARE - Administrator, Civil Service (Medical): No    Lack of Transportation (Non-Medical): No  Physical Activity: Not on file  Stress: No Stress Concern Present (01/07/2023)   [employer] of Occupational Health - Occupational Stress Questionnaire    Feeling of Stress : Not at all  Social Connections: Not on file  Intimate Partner Violence: Not At Risk (01/07/2023)   Humiliation, Afraid, Rape, and Kick questionnaire    Fear of Current or Ex-Partner: No    Emotionally Abused: No    Physically Abused: No    Sexually Abused: No    Family History  Problem Relation Age of Onset   Prostate cancer Neg Hx    Bladder Cancer Neg Hx    Kidney cancer Neg Hx     No Known Allergies  Outpatient Medications Prior to Visit  Medication Sig   amLODipine -benazepril  (LOTREL) 10-20 MG capsule Take 1 capsule by mouth daily.   ascorbic acid  (VITAMIN C) 500 MG tablet Take 1 tablet (500 mg total) by mouth daily.   ferrous sulfate  325 (65 FE) MG tablet Take 1 tablet (325 mg total) by mouth daily with breakfast.   hydrochlorothiazide  (HYDRODIURIL ) 12.5 MG tablet TAKE ONE TABLET BY MOUTH ONE TIME DAILY   rosuvastatin  (CRESTOR )  10 MG tablet Take 1 tablet (10 mg total) by mouth daily.   tamsulosin  (FLOMAX ) 0.4 MG CAPS capsule TAKE ONE CAPSULE BY MOUTH ONE TIME DAILY   zinc  sulfate 220 (50 Zn) MG capsule Take 1 capsule (220 mg total) by mouth daily.   No facility-administered medications prior to visit.    Review of Systems  Constitutional: Negative.   HENT: Negative.    Eyes: Negative.   Respiratory: Negative.  Negative for shortness of breath.   Cardiovascular: Negative.  Negative for chest pain.  Gastrointestinal: Negative.  Negative for abdominal pain, constipation and diarrhea.  Genitourinary: Negative.   Musculoskeletal:  Negative for joint pain and myalgias.  Skin: Negative.   Neurological: Negative.  Negative for dizziness and headaches.   Endo/Heme/Allergies: Negative.   All other systems reviewed and are negative.      Objective:   BP 116/71   Pulse 87   Ht 5' 9 (1.753 m)   Wt 201 lb 6.4 oz (91.4 kg)   SpO2 97%   BMI 29.74 kg/m   Vitals:   08/07/23 0937  BP: 116/71  Pulse: 87  Height: 5' 9 (1.753 m)  Weight: 201 lb 6.4 oz (91.4 kg)  SpO2: 97%  BMI (Calculated): 29.73    Physical Exam Nursing note reviewed.  Constitutional:      Appearance: Normal appearance. He is normal weight.  HENT:     Head: Normocephalic and atraumatic.     Nose: Nose normal.     Mouth/Throat:     Mouth: Mucous membranes are moist.     Pharynx: Oropharynx is clear.   Eyes:     Extraocular Movements: Extraocular movements intact.     Conjunctiva/sclera: Conjunctivae normal.     Pupils: Pupils are equal, round, and reactive to light.    Cardiovascular:     Rate and Rhythm: Normal rate and regular rhythm.     Pulses: Normal pulses.     Heart sounds: Normal heart sounds.  Pulmonary:     Effort: Pulmonary effort is normal.     Breath sounds: Normal breath sounds.  Abdominal:     General: Abdomen is flat. Bowel sounds are normal.     Palpations: Abdomen is soft.   Musculoskeletal:        General: Normal range of motion.     Cervical back: Normal range of motion.   Skin:    General: Skin is warm and dry.   Neurological:     General: No focal deficit present.     Mental Status: He is alert and oriented to person, place, and time.   Psychiatric:        Mood and Affect: Mood normal.        Behavior: Behavior normal.        Thought Content: Thought content normal.        Judgment: Judgment normal.      No results found for any visits on 08/07/23.  No results found for this or any previous visit (from the past 2160 hours).    Assessment & Plan:  Lab work today Referral sent to ophthalmology.  Problem List Items Addressed This Visit       Cardiovascular and Mediastinum   Essential hypertension, benign  - Primary   Relevant Orders   CMP14+EGFR   Magnesium      Other   Mixed hyperlipidemia   Relevant Orders   Lipid Profile   Serum potassium elevated   Relevant Orders   CMP14+EGFR  Other Visit Diagnoses       Dryness of eye       Relevant Orders   Ambulatory referral to Ophthalmology       Return in about 6 months (around 02/06/2024).   Total time spent: 25 minutes  Google, NP  08/07/2023   This document may have been prepared by Dragon Voice Recognition software and as such may include unintentional dictation errors.

## 2023-08-08 ENCOUNTER — Ambulatory Visit: Payer: Self-pay | Admitting: Cardiology

## 2023-08-08 LAB — CMP14+EGFR
ALT: 61 IU/L — ABNORMAL HIGH (ref 0–44)
AST: 14 IU/L (ref 0–40)
Albumin: 4.1 g/dL (ref 3.7–4.7)
Alkaline Phosphatase: 53 IU/L (ref 44–121)
BUN/Creatinine Ratio: 16 (ref 10–24)
BUN: 12 mg/dL (ref 8–27)
Bilirubin Total: 0.5 mg/dL (ref 0.0–1.2)
CO2: 23 mmol/L (ref 20–29)
Calcium: 8.9 mg/dL (ref 8.6–10.2)
Chloride: 101 mmol/L (ref 96–106)
Creatinine, Ser: 0.75 mg/dL — ABNORMAL LOW (ref 0.76–1.27)
Globulin, Total: 2.7 g/dL (ref 1.5–4.5)
Glucose: 108 mg/dL — ABNORMAL HIGH (ref 70–99)
Potassium: 4.4 mmol/L (ref 3.5–5.2)
Sodium: 140 mmol/L (ref 134–144)
Total Protein: 6.8 g/dL (ref 6.0–8.5)
eGFR: 88 mL/min/{1.73_m2} (ref >59)

## 2023-08-08 LAB — LIPID PANEL
Chol/HDL Ratio: 2.2 ratio (ref 0.0–5.0)
Cholesterol, Total: 62 mg/dL — ABNORMAL LOW (ref 100–199)
HDL: 28 mg/dL — ABNORMAL LOW (ref >39)
LDL Chol Calc (NIH): 21 mg/dL (ref 0–99)
Triglycerides: 46 mg/dL (ref 0–149)
VLDL Cholesterol Cal: 13 mg/dL (ref 5–40)

## 2023-08-08 LAB — MAGNESIUM: Magnesium: 1.9 mg/dL (ref 1.6–2.3)

## 2023-08-22 ENCOUNTER — Other Ambulatory Visit: Payer: Self-pay | Admitting: Internal Medicine

## 2023-08-22 DIAGNOSIS — I10 Essential (primary) hypertension: Secondary | ICD-10-CM

## 2023-09-12 ENCOUNTER — Other Ambulatory Visit: Payer: Self-pay | Admitting: Internal Medicine

## 2023-09-12 DIAGNOSIS — R972 Elevated prostate specific antigen [PSA]: Secondary | ICD-10-CM

## 2023-12-10 ENCOUNTER — Other Ambulatory Visit: Payer: Self-pay

## 2023-12-10 DIAGNOSIS — R972 Elevated prostate specific antigen [PSA]: Secondary | ICD-10-CM

## 2023-12-10 DIAGNOSIS — E782 Mixed hyperlipidemia: Secondary | ICD-10-CM

## 2023-12-10 MED ORDER — ROSUVASTATIN CALCIUM 10 MG PO TABS: 10.0000 mg | ORAL_TABLET | Freq: Every day | ORAL | 2 refills | Status: AC

## 2023-12-10 MED ORDER — TAMSULOSIN HCL 0.4 MG PO CAPS: 0.4000 mg | ORAL_CAPSULE | Freq: Every day | ORAL | 3 refills | Status: AC

## 2023-12-11 ENCOUNTER — Other Ambulatory Visit: Payer: Self-pay

## 2023-12-11 DIAGNOSIS — I10 Essential (primary) hypertension: Secondary | ICD-10-CM

## 2023-12-11 MED ORDER — AMLODIPINE BESY-BENAZEPRIL HCL 10-20 MG PO CAPS: 1.0000 | ORAL_CAPSULE | Freq: Every day | ORAL | 2 refills | Status: AC

## 2023-12-18 ENCOUNTER — Other Ambulatory Visit: Payer: Self-pay | Admitting: Cardiology

## 2023-12-18 DIAGNOSIS — I10 Essential (primary) hypertension: Secondary | ICD-10-CM

## 2024-01-27 ENCOUNTER — Ambulatory Visit: Admitting: Cardiology

## 2024-01-27 ENCOUNTER — Encounter: Payer: Self-pay | Admitting: Cardiology

## 2024-01-27 VITALS — BP 128/64 | HR 89 | Ht 69.0 in | Wt 207.0 lb

## 2024-01-27 DIAGNOSIS — D649 Anemia, unspecified: Secondary | ICD-10-CM

## 2024-01-27 DIAGNOSIS — N400 Enlarged prostate without lower urinary tract symptoms: Secondary | ICD-10-CM

## 2024-01-27 DIAGNOSIS — E782 Mixed hyperlipidemia: Secondary | ICD-10-CM

## 2024-01-27 DIAGNOSIS — I10 Essential (primary) hypertension: Secondary | ICD-10-CM

## 2024-01-27 DIAGNOSIS — Z131 Encounter for screening for diabetes mellitus: Secondary | ICD-10-CM

## 2024-01-27 NOTE — Progress Notes (Signed)
 Established Patient Office Visit  Subjective:  Patient ID: Dennis Wilkins, male    DOB: 1940/11/08  Age: 83 y.o. MRN: [id number]  Chief Complaint  Patient presents with   Follow-up    6 month follow up    Patient in office for 6 month follow up. Patient doing well, no complaints today. Did not have blood work done, will do today and call with results.  Blood pressure controlled.  Continue current medications.     No other concerns at this time.   Past Medical History:  Diagnosis Date   COVID-19 12/01/2019   diagnosed at [facility] on 12/01/2019   Dyspnea    Encephalopathy     Past Surgical History:  Procedure Laterality Date   BACK SURGERY     lower lumbar    Social History   Socioeconomic History   Marital status: Single    Spouse name: Not on file   Number of children: Not on file   Years of education: Not on file   Highest education level: Not on file  Occupational History   Not on file  Tobacco Use   Smoking status: Former   Smokeless tobacco: Never  Vaping Use   Vaping status: Never Used  Substance and Sexual Activity   Alcohol use: Not Currently   Drug use: Never   Sexual activity: Not on file  Other Topics Concern   Not on file  Social History Narrative   Not on file   Social Drivers of Health   Tobacco Use: Medium Risk (01/27/2024)   Patient History    Smoking Tobacco Use: Former    Smokeless Tobacco Use: Never    Passive Exposure: Not on file  Financial Resource Strain: Low Risk (01/07/2023)   Overall Financial Resource Strain (CARDIA)    Difficulty of Paying Living Expenses: Not hard at all  Food Insecurity: No Food Insecurity (01/07/2023)   Hunger Vital Sign    Worried About Running Out of Food in the Last Year: Never true    Ran Out of Food in the Last Year: Never true  Transportation Needs: No Transportation Needs (01/07/2023)   PRAPARE - Administrator, Civil Service (Medical): No    Lack of Transportation (Non-Medical): No   Physical Activity: Not on file  Stress: No Stress Concern Present (01/07/2023)   [employer] of Occupational Health - Occupational Stress Questionnaire    Feeling of Stress : Not at all  Social Connections: Not on file  Intimate Partner Violence: Not At Risk (01/07/2023)   Humiliation, Afraid, Rape, and Kick questionnaire    Fear of Current or Ex-Partner: No    Emotionally Abused: No    Physically Abused: No    Sexually Abused: No  Depression (PHQ2-9): Low Risk (01/07/2023)   Depression (PHQ2-9)    PHQ-2 Score: 0  Alcohol Screen: Not on file  Housing: Low Risk (01/07/2023)   Housing    Last Housing Risk Score: 0  Utilities: Not At Risk (01/07/2023)   [employer] Utilities    Threatened with loss of utilities: No  Health Literacy: Adequate Health Literacy (01/07/2023)   B1300 Health Literacy    Frequency of need for help with medical instructions: Never    Family History  Problem Relation Age of Onset   Prostate cancer Neg Hx    Bladder Cancer Neg Hx    Kidney cancer Neg Hx     Allergies[1]  Show/hide medication list[2]  ROS     Objective:  BP 128/64   Pulse 89   Ht 5' 9 (1.753 m)   Wt 207 lb (93.9 kg)   SpO2 98%   BMI 30.57 kg/m   Vitals:   01/27/24 1039  BP: 128/64  Pulse: 89  Height: 5' 9 (1.753 m)  Weight: 207 lb (93.9 kg)  SpO2: 98%  BMI (Calculated): 30.55    Physical Exam   No results found for any visits on 01/27/24.  No results found for this or any previous visit (from the past 2160 hours).    Assessment & Plan:  Blood work today Continue current medications  Problem List Items Addressed This Visit       Cardiovascular and Mediastinum   Essential hypertension, benign - Primary   Relevant Orders   CMP14+EGFR     Genitourinary   Benign prostatic hyperplasia without lower urinary tract symptoms     Other   Absolute anemia   Relevant Orders   CBC with Differential/Platelet   Mixed hyperlipidemia   Relevant Orders    Lipid Profile   Other Visit Diagnoses       Diabetes mellitus screening       Relevant Orders   Hemoglobin A1c       Return in about 6 months (around 07/27/2024) for fasting lab work prior.   Total time spent: 25 minutes. This time includes review of previous notes and results and patient face to face interaction during today's visit.    Jeoffrey Pollen, NP  01/27/2024   This document may have been prepared by Dragon Voice Recognition software and as such may include unintentional dictation errors.      [1] No Known Allergies [2]  Outpatient Medications Prior to Visit  Medication Sig   amLODipine -benazepril  (LOTREL) 10-20 MG capsule Take 1 capsule by mouth daily.   ascorbic acid  (VITAMIN C) 500 MG tablet Take 1 tablet (500 mg total) by mouth daily.   ferrous sulfate  325 (65 FE) MG tablet Take 1 tablet (325 mg total) by mouth daily with breakfast.   hydrochlorothiazide  (HYDRODIURIL ) 12.5 MG tablet TAKE ONE TABLET BY MOUTH ONE TIME DAILY   rosuvastatin  (CRESTOR ) 10 MG tablet Take 1 tablet (10 mg total) by mouth daily.   tamsulosin  (FLOMAX ) 0.4 MG CAPS capsule Take 1 capsule (0.4 mg total) by mouth daily.   zinc  sulfate 220 (50 Zn) MG capsule Take 1 capsule (220 mg total) by mouth daily.   No facility-administered medications prior to visit.

## 2024-01-28 LAB — CMP14+EGFR
ALT: 12 IU/L (ref 0–44)
AST: 18 IU/L (ref 0–40)
Albumin: 4.4 g/dL (ref 3.7–4.7)
Alkaline Phosphatase: 61 IU/L (ref 48–129)
BUN/Creatinine Ratio: 15 (ref 10–24)
BUN: 16 mg/dL (ref 8–27)
Bilirubin Total: 0.6 mg/dL (ref 0.0–1.2)
CO2: 19 mmol/L — ABNORMAL LOW (ref 20–29)
Calcium: 8.9 mg/dL (ref 8.6–10.2)
Chloride: 103 mmol/L (ref 96–106)
Creatinine, Ser: 1.07 mg/dL (ref 0.76–1.27)
Globulin, Total: 2 g/dL (ref 1.5–4.5)
Glucose: 121 mg/dL — ABNORMAL HIGH (ref 70–99)
Potassium: 4 mmol/L (ref 3.5–5.2)
Sodium: 140 mmol/L (ref 134–144)
Total Protein: 6.4 g/dL (ref 6.0–8.5)
eGFR: 68 mL/min/1.73 (ref >59)

## 2024-01-28 LAB — CBC WITH DIFFERENTIAL/PLATELET
Basophils Absolute: 0 x10E3/uL (ref 0.0–0.2)
Basos: 0 %
EOS (ABSOLUTE): 0.1 x10E3/uL (ref 0.0–0.4)
Eos: 2 %
Hematocrit: 35.2 % — ABNORMAL LOW (ref 37.5–51.0)
Hemoglobin: 12.1 g/dL — ABNORMAL LOW (ref 13.0–17.7)
Immature Grans (Abs): 0 x10E3/uL (ref 0.0–0.1)
Immature Granulocytes: 0 %
Lymphocytes Absolute: 1.5 x10E3/uL (ref 0.7–3.1)
Lymphs: 30 %
MCH: 34 pg — ABNORMAL HIGH (ref 26.6–33.0)
MCHC: 34.4 g/dL (ref 31.5–35.7)
MCV: 99 fL — ABNORMAL HIGH (ref 79–97)
Monocytes Absolute: 0.8 x10E3/uL (ref 0.1–0.9)
Monocytes: 17 %
Neutrophils Absolute: 2.4 x10E3/uL (ref 1.4–7.0)
Neutrophils: 50 %
Platelets: 156 x10E3/uL (ref 150–450)
RBC: 3.56 x10E6/uL — ABNORMAL LOW (ref 4.14–5.80)
RDW: 14.9 % (ref 11.6–15.4)
WBC: 4.9 x10E3/uL (ref 3.4–10.8)

## 2024-01-28 LAB — LIPID PANEL
Chol/HDL Ratio: 3.1 ratio (ref 0.0–5.0)
Cholesterol, Total: 113 mg/dL (ref 100–199)
HDL: 36 mg/dL — ABNORMAL LOW (ref >39)
LDL Chol Calc (NIH): 56 mg/dL (ref 0–99)
Triglycerides: 114 mg/dL (ref 0–149)
VLDL Cholesterol Cal: 21 mg/dL (ref 5–40)

## 2024-01-28 LAB — HEMOGLOBIN A1C
Est. average glucose Bld gHb Est-mCnc: 105 mg/dL
Hgb A1c MFr Bld: 5.3 % (ref 4.8–5.6)

## 2024-02-13 ENCOUNTER — Ambulatory Visit: Payer: Self-pay | Admitting: Cardiology

## 2024-02-13 NOTE — Progress Notes (Signed)
 Pt informed

## 2024-02-13 NOTE — Progress Notes (Signed)
 Pt. Requested a call back

## 2024-03-03 ENCOUNTER — Other Ambulatory Visit: Payer: Self-pay | Admitting: Cardiology

## 2024-03-03 DIAGNOSIS — I10 Essential (primary) hypertension: Secondary | ICD-10-CM

## 2024-08-03 ENCOUNTER — Ambulatory Visit: Admitting: Cardiology
